# Patient Record
Sex: Male | Born: 1960 | Race: White | Hispanic: No | Marital: Married | Smoking: Never smoker
Health system: Southern US, Community
[De-identification: ages and names within clinical notes are randomized; demographics above are authoritative.]

## PROBLEM LIST (undated history)

## (undated) DIAGNOSIS — K219 Gastro-esophageal reflux disease without esophagitis: Secondary | ICD-10-CM

## (undated) DIAGNOSIS — T7840XA Allergy, unspecified, initial encounter: Secondary | ICD-10-CM

## (undated) DIAGNOSIS — M1990 Unspecified osteoarthritis, unspecified site: Secondary | ICD-10-CM

## (undated) HISTORY — DX: Gastro-esophageal reflux disease without esophagitis: K21.9

## (undated) HISTORY — PX: WISDOM TOOTH EXTRACTION: SHX21

## (undated) HISTORY — DX: Allergy, unspecified, initial encounter: T78.40XA

## (undated) HISTORY — DX: Unspecified osteoarthritis, unspecified site: M19.90

## (undated) HISTORY — PX: VASECTOMY: SHX75

---
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
## 2013-09-16 ENCOUNTER — Telehealth: Payer: Self-pay | Admitting: Family Medicine

## 2013-09-16 NOTE — Telephone Encounter (Signed)
Pt advised no appts available for oday. Pt will go to urgent care.

## 2019-09-09 ENCOUNTER — Ambulatory Visit (INDEPENDENT_AMBULATORY_CARE_PROVIDER_SITE_OTHER): Payer: Commercial Indemnity | Admitting: Family Medicine

## 2019-09-09 ENCOUNTER — Ambulatory Visit (INDEPENDENT_AMBULATORY_CARE_PROVIDER_SITE_OTHER): Payer: Commercial Indemnity

## 2019-09-09 ENCOUNTER — Encounter: Payer: Self-pay | Admitting: Family Medicine

## 2019-09-09 ENCOUNTER — Other Ambulatory Visit: Payer: Self-pay

## 2019-09-09 DIAGNOSIS — M545 Low back pain: Secondary | ICD-10-CM

## 2019-09-09 DIAGNOSIS — G8929 Other chronic pain: Secondary | ICD-10-CM

## 2019-09-09 NOTE — Progress Notes (Signed)
   Office Visit Note   Patient: Travis Zuniga           Date of Birth: 11/19/1960           MRN: [id number] Visit Date: 09/09/2019 Requested by: No referring provider defined for this encounter. PCP: No primary care provider on file.  Subjective: Chief Complaint  Patient presents with  . Lower Back - Pain    Been seeing Dr. Owens Shark for chiropractic care. Diagnosed with degenerative disease Lsp 4-5 years ago. Back "went out" in July. The last adjustment 2 weeks ago helped to where he is currently pain-free. Stretching & icing help.    HPI: He is here at the request of Dr. Purcell Nails for low back pain.  For about 5 or 6 years he has had intermittent pain across the lower back.  Pain does not radiate down the legs, and when it flares up it is worse when standing upright and better when sitting down.  Typically chiropractic adjustments give him quick and very good relief lasting for quite a while.  Most recently he had a flareup in the beginning of July and it took a lot more treatments to get him pain-free like he has been for the past 2 weeks.  Denies any bowel or bladder dysfunction.  He takes ibuprofen rarely for his pain.  He feels better when walking or when making frequent transitions.  He works for the [employer].                ROS:   All other systems were reviewed and are negative.  Objective: Vital Signs: There were no vitals taken for this visit.  Physical Exam:  General:  Alert and oriented, in no acute distress. Pulm:  Breathing unlabored. Psy:  Normal mood, congruent affect.  Low back: Leg lengths are equal, no scoliosis.  No significant tenderness to palpation today.  Pain area is around L4-5, L5-S1.  Negative straight leg raise, good hip range of motion.  Lower extremity strength and reflexes are normal.  Imaging: XR Lumbar Spine 2-3 Views  Result Date: 09/09/2019 X-rays of the lumbar spine reveal anatomic alignment, no hip joint DJD.  SI joints look normal.  There is  mild to moderate L5-S1 degenerative disc disease.  Moderate facet joint arthritis from L3-S1.  No sign of compression fracture or neoplasm.   Assessment & Plan: 1.  Chronic low back pain, suspect due to lumbar facet joint arthropathy.  He has mild to moderate degenerative disc disease at L5-S1 as well. -He will continue with chiropractic.  We will try core strengthening.  Glucosamine, turmeric, avoidance of sugar and processed carbohydrates. -If symptoms worsen, could contemplate facet injections.  If that gives only temporary relief, then radiofrequency neurotomy. -MRI if he fails to improve.     Procedures: No procedures performed  No notes on file     PMFS History: There are no problems to display for this patient.  History reviewed. No pertinent past medical history.  History reviewed. No pertinent family history.  History reviewed. No pertinent surgical history. Social History   Occupational History  . Not on file  Tobacco Use  . Smoking status: Never Smoker  . Smokeless tobacco: Never Used  Substance and Sexual Activity  . Alcohol use: Yes    Comment: 12-pack per month  . Drug use: Not on file  . Sexual activity: Not on file

## 2019-09-09 NOTE — Patient Instructions (Addendum)
   Diagnosis:  Lumbar facet joint arthritis.  Also, mild to moderate degenerative disc disease at L5-S1.  Treatment:  - Core strength - Weight loss - Minimize sugar and processed carbs  - Glucosamine sulfate:  1,000-1,500 mg twice daily - Turmeric:  500 mg twice daily  Future options:    - Lumbar facet joint injections. - MRI if pain worsens.

## 2019-09-20 ENCOUNTER — Other Ambulatory Visit: Payer: Self-pay

## 2019-09-20 ENCOUNTER — Encounter: Payer: Self-pay | Admitting: Family Medicine

## 2019-09-20 ENCOUNTER — Ambulatory Visit (INDEPENDENT_AMBULATORY_CARE_PROVIDER_SITE_OTHER): Payer: Commercial Indemnity | Admitting: Family Medicine

## 2019-09-20 VITALS — BP 128/83 | HR 56 | Ht 70.25 in | Wt 250.4 lb

## 2019-09-20 DIAGNOSIS — Z6835 Body mass index (BMI) 35.0-35.9, adult: Secondary | ICD-10-CM

## 2019-09-20 DIAGNOSIS — Z0000 Encounter for general adult medical examination without abnormal findings: Secondary | ICD-10-CM | POA: Diagnosis not present

## 2019-09-20 DIAGNOSIS — L608 Other nail disorders: Secondary | ICD-10-CM | POA: Diagnosis not present

## 2019-09-20 DIAGNOSIS — E6609 Other obesity due to excess calories: Secondary | ICD-10-CM | POA: Diagnosis not present

## 2019-09-20 DIAGNOSIS — E66812 Obesity, class 2: Secondary | ICD-10-CM | POA: Insufficient documentation

## 2019-09-20 DIAGNOSIS — L989 Disorder of the skin and subcutaneous tissue, unspecified: Secondary | ICD-10-CM

## 2019-09-20 DIAGNOSIS — M722 Plantar fascial fibromatosis: Secondary | ICD-10-CM

## 2019-09-20 DIAGNOSIS — H9313 Tinnitus, bilateral: Secondary | ICD-10-CM

## 2019-09-20 NOTE — Progress Notes (Signed)
Office Visit Note   Patient: Travis Zuniga           Date of Birth: [birth year]           MRN: [id number] Visit Date: 09/20/2019 Requested by: No referring provider defined for this encounter. PCP: No primary care provider on file.  Subjective: Chief Complaint  Patient presents with  . establish primary care    HPI: He is here for a wellness exam.  He has not been to a PCP in years.  Since last visit his back pain has improved significantly.  From a medical standpoint he has been overall healthy.  He does struggle with ringing in both ears.  This has been going on for a while but seems to be getting a little bit worse.  His wife tells him that he has hearing loss as well.  He has a skin lesion on his left forearm that he wanted evaluated.  He has a family history of fatty liver disease leading to cirrhosis.  This has happened in several family members.  There is also a strong history of alcoholism.  Patient does not drink alcohol excessively.  One of his parents also had stroke, and both of his older brothers have been diagnosed with diabetes.  Patient is asymptomatic from that standpoint.                  ROS:   All other systems were reviewed and are negative.  Objective: Vital Signs: BP 128/83   Pulse (!) 56   Ht 5' 10.25" (1.784 m)   Wt 250 lb 6.4 oz (113.6 kg)   BMI 35.67 kg/m   Physical Exam:  General:  Alert and oriented, in no acute distress. Pulm:  Breathing unlabored. Psy:  Normal mood, congruent affect. Skin:  Possible seborrheic keratosis on left forearm.  No other suspicious lesions seen.  HEENT:  [place]/AT, PERRLA, EOM Full, no nystagmus.  Funduscopic examination within normal limits.  No conjunctival erythema.  Tympanic membranes are pearly gray with normal landmarks.  External ear canals are normal.  Nasal passages are clear.  Oropharynx is clear.  No significant lymphadenopathy.  No thyromegaly or nodules.  2+ carotid pulses without bruits. CV: Regular  rate and rhythm without murmurs, rubs, or gallops.  No peripheral edema.  2+ radial and posterior tibial pulses. Lungs: Clear to auscultation throughout with no wheezing or areas of consolidation. Abdomen: No hepatosplenomegaly or masses.  Bowel sounds are active. Ext:  Bilateral longitudinal ridging of the fingernais.   Imaging: No results found.  Assessment & Plan: 1.  Wellness exam - Labs today. - Colonoscopy referral.  2.  Tinnitus - ENT eval  3.  Skin lesion - Derm referral  4.  Ridging of nails - Check for autoimmunity  5.  Obesity with FH of NASH - Labs today - Consider ultrasound       Procedures: No procedures performed  No notes on file     PMFS History: Patient Active Problem List   Diagnosis Date Noted  . Class 2 obesity due to excess calories without serious comorbidity with body mass index (BMI) of 35.0 to 35.9 in adult 09/20/2019  . Tinnitus of both ears 09/20/2019   History reviewed. No pertinent past medical history.  Family History  Problem Relation Age of Onset  . Heart disease Mother        rheumatic fever as child  . Stroke Mother        Smoker  .  Alcohol abuse Mother   . Alcohol abuse Father   . Liver disease Father        Possibly  . Arthritis Sister   . Diabetes Brother   . Liver disease Brother        NASH  . Heart disease Paternal Grandfather        MI  . Diabetes Brother   . Healthy Sister     History reviewed. No pertinent surgical history. Social History   Occupational History  . Not on file  Tobacco Use  . Smoking status: Never Smoker  . Smokeless tobacco: Never Used  Substance and Sexual Activity  . Alcohol use: Yes    Comment: 12-pack per month  . Drug use: Not on file  . Sexual activity: Not on file

## 2019-09-22 LAB — COMPREHENSIVE METABOLIC PANEL
AG Ratio: 1.3 (calc) (ref 1.0–2.5)
ALT: 69 U/L — ABNORMAL HIGH (ref 9–46)
AST: 42 U/L — ABNORMAL HIGH (ref 10–35)
Albumin: 3.9 g/dL (ref 3.6–5.1)
Alkaline phosphatase (APISO): 48 U/L (ref 35–144)
BUN: 12 mg/dL (ref 7–25)
CO2: 29 mmol/L (ref 20–32)
Calcium: 9.4 mg/dL (ref 8.6–10.3)
Chloride: 103 mmol/L (ref 98–110)
Creat: 0.82 mg/dL (ref 0.70–1.25)
Globulin: 3 g/dL (calc) (ref 1.9–3.7)
Glucose, Bld: 107 mg/dL — ABNORMAL HIGH (ref 65–99)
Potassium: 4.6 mmol/L (ref 3.5–5.3)
Sodium: 138 mmol/L (ref 135–146)
Total Bilirubin: 0.6 mg/dL (ref 0.2–1.2)
Total Protein: 6.9 g/dL (ref 6.1–8.1)

## 2019-09-22 LAB — LIPID PANEL
Cholesterol: 170 mg/dL (ref <200)
HDL: 57 mg/dL (ref >=40)
LDL Cholesterol (Calc): 92 mg/dL (calc)
Non-HDL Cholesterol (Calc): 113 mg/dL (calc) (ref <130)
Total CHOL/HDL Ratio: 3 (calc) (ref <5.0)
Triglycerides: 111 mg/dL (ref <150)

## 2019-09-22 LAB — HEMOGLOBIN A1C
Hgb A1c MFr Bld: 5.6 % of total Hgb (ref <5.7)
Mean Plasma Glucose: 114 (calc)
eAG (mmol/L): 6.3 (calc)

## 2019-09-22 LAB — CBC WITH DIFFERENTIAL/PLATELET
Absolute Monocytes: 554 cells/uL (ref 200–950)
Basophils Absolute: 40 cells/uL (ref 0–200)
Basophils Relative: 0.6 %
Eosinophils Absolute: 119 cells/uL (ref 15–500)
Eosinophils Relative: 1.8 %
HCT: 46.1 % (ref 38.5–50.0)
Hemoglobin: 15.3 g/dL (ref 13.2–17.1)
Lymphs Abs: 2396 cells/uL (ref 850–3900)
MCH: 32.2 pg (ref 27.0–33.0)
MCHC: 33.2 g/dL (ref 32.0–36.0)
MCV: 97.1 fL (ref 80.0–100.0)
MPV: 11.4 fL (ref 7.5–12.5)
Monocytes Relative: 8.4 %
Neutro Abs: 3491 cells/uL (ref 1500–7800)
Neutrophils Relative %: 52.9 %
Platelets: 193 10*3/uL (ref 140–400)
RBC: 4.75 10*6/uL (ref 4.20–5.80)
RDW: 12 % (ref 11.0–15.0)
Total Lymphocyte: 36.3 %
WBC: 6.6 10*3/uL (ref 3.8–10.8)

## 2019-09-22 LAB — THYROID PEROXIDASE ANTIBODY: Thyroperoxidase Ab SerPl-aCnc: 1 IU/mL (ref <9)

## 2019-09-22 LAB — FERRITIN: Ferritin: 177 ng/mL (ref 24–380)

## 2019-09-22 LAB — THYROID PANEL WITH TSH
Free Thyroxine Index: 1.8 (ref 1.4–3.8)
T3 Uptake: 30 % (ref 22–35)
T4, Total: 6 ug/dL (ref 4.9–10.5)
TSH: 4.44 mIU/L (ref 0.40–4.50)

## 2019-09-22 LAB — ANA: Anti Nuclear Antibody (ANA): POSITIVE — AB

## 2019-09-22 LAB — PSA: PSA: 1.27 ng/mL (ref <=4.0)

## 2019-09-22 LAB — ANTI-NUCLEAR AB-TITER (ANA TITER): ANA Titer 1: 1:40 {titer} — ABNORMAL HIGH

## 2019-09-22 LAB — HIGH SENSITIVITY CRP: hs-CRP: 0.8 mg/L

## 2019-09-23 ENCOUNTER — Telehealth: Payer: Self-pay | Admitting: Family Medicine

## 2019-09-23 DIAGNOSIS — R7989 Other specified abnormal findings of blood chemistry: Secondary | ICD-10-CM

## 2019-09-23 DIAGNOSIS — Z0000 Encounter for general adult medical examination without abnormal findings: Secondary | ICD-10-CM

## 2019-09-23 DIAGNOSIS — R739 Hyperglycemia, unspecified: Secondary | ICD-10-CM

## 2019-09-23 DIAGNOSIS — R768 Other specified abnormal immunological findings in serum: Secondary | ICD-10-CM

## 2019-09-23 NOTE — Telephone Encounter (Signed)
Labs are notable for the following:  ANA is positive with a low titer of 1: 40.  This indicates autoimmunity of some sort.  Often, this can be a temporary finding.  I will email some suggestions and we can recheck this test in 4 to 6 months.  Thyroid antibodies were normal.  Thyroid panel is in normal range, but TSH is slightly higher than preferred at 4.44.  Ideally this would be 0.5-1.0.  And under-functioning thyroid can be a source of fatigue.  Will recheck this in 4-6 months as well.  For thyroid function I suggest:  - Selenium 150-200 mcg daily - Zinc 20-30 mg daily - Vitamin D3:  2,000 IU daily - Vitamin A:  2,000 IU daily - Iodine:  150 mcg daily  Ferritin and CBC look good, no iron deficiency.  Blood glucose is in prediabetes range at 107, but hemoglobin A1c is still normal.  It is very important to exercise regularly and to minimize dietary intake of processed carbohydrates including breads, pastas, cereals, sugars and sweets.  Recheck this in 4 to 6 months.  All else looks good.

## 2019-09-24 NOTE — Addendum Note (Signed)
Addended by: Hortencia Pilar on: 09/24/2019 07:58 AM   Modules accepted: Orders

## 2019-10-18 NOTE — Addendum Note (Signed)
Addended by: Hortencia Pilar on: 10/18/2019 11:24 AM   Modules accepted: Orders

## 2019-11-04 ENCOUNTER — Encounter: Payer: Self-pay | Admitting: Internal Medicine

## 2019-12-22 ENCOUNTER — Ambulatory Visit (AMBULATORY_SURGERY_CENTER): Payer: Self-pay

## 2019-12-22 ENCOUNTER — Other Ambulatory Visit: Payer: Self-pay

## 2019-12-22 VITALS — Ht 70.0 in | Wt 229.0 lb

## 2019-12-22 DIAGNOSIS — Z1211 Encounter for screening for malignant neoplasm of colon: Secondary | ICD-10-CM

## 2019-12-22 NOTE — Progress Notes (Signed)
No egg or soy allergy known to patient  No issues with past sedation with any surgeries or procedures No intubation problems in the past  No FH of Malignant Hyperthermia No diet pills per patient No home 02 use per patient  No blood thinners per patient  Pt denies issues with constipation  No A fib or A flutter  EMMI video to pt or via [place] 19 guidelines implemented in PV today with Pt and RN  Pt is fully vaccinated  for Covid + Booster Due to the COVID-19 pandemic we are asking patients to follow certain guidelines.  Pt aware of COVID protocols and [facility] guidelines

## 2019-12-27 ENCOUNTER — Encounter: Payer: Self-pay | Admitting: Internal Medicine

## 2020-01-04 DIAGNOSIS — Z86010 Personal history of colonic polyps: Secondary | ICD-10-CM

## 2020-01-04 DIAGNOSIS — Z860101 Personal history of adenomatous and serrated colon polyps: Secondary | ICD-10-CM

## 2020-01-04 HISTORY — DX: Personal history of adenomatous and serrated colon polyps: Z86.0101

## 2020-01-04 HISTORY — DX: Personal history of colonic polyps: Z86.010

## 2020-01-05 ENCOUNTER — Ambulatory Visit (AMBULATORY_SURGERY_CENTER): Payer: Commercial Indemnity | Admitting: Internal Medicine

## 2020-01-05 ENCOUNTER — Other Ambulatory Visit: Payer: Self-pay

## 2020-01-05 ENCOUNTER — Encounter: Payer: Self-pay | Admitting: Internal Medicine

## 2020-01-05 VITALS — BP 121/76 | HR 62 | Temp 97.1°F | Resp 13 | Ht 70.25 in | Wt 229.0 lb

## 2020-01-05 DIAGNOSIS — Z1211 Encounter for screening for malignant neoplasm of colon: Secondary | ICD-10-CM | POA: Diagnosis present

## 2020-01-05 DIAGNOSIS — D120 Benign neoplasm of cecum: Secondary | ICD-10-CM

## 2020-01-05 DIAGNOSIS — D128 Benign neoplasm of rectum: Secondary | ICD-10-CM

## 2020-01-05 DIAGNOSIS — K621 Rectal polyp: Secondary | ICD-10-CM | POA: Diagnosis not present

## 2020-01-05 HISTORY — PX: COLONOSCOPY: SHX174

## 2020-01-05 MED ORDER — SODIUM CHLORIDE 0.9 % IV SOLN: 500.0000 mL | Freq: Once | INTRAVENOUS | Status: DC

## 2020-01-05 NOTE — Op Note (Signed)
[facility] Patient Name: Travis Zuniga Procedure Date: 01/05/2020 11:21 AM MRN: [id number] Endoscopist: Gatha Mayer , MD Age: 59 Referring MD:  Date of Birth: 07/12/1960 Gender: Male Account #: [id number] Procedure:                Colonoscopy Indications:              Screening for colorectal malignant neoplasm, This                            is the patient's first colonoscopy Medicines:                Propofol per Anesthesia, Monitored Anesthesia Care Procedure:                Pre-Anesthesia Assessment:                           - Prior to the procedure, a History and Physical                            was performed, and patient medications and                            allergies were reviewed. The patient's tolerance of                            previous anesthesia was also reviewed. The risks                            and benefits of the procedure and the sedation                            options and risks were discussed with the patient.                            All questions were answered, and informed consent                            was obtained. Prior Anticoagulants: The patient has                            taken no previous anticoagulant or antiplatelet                            agents. ASA Grade Assessment: II - A patient with                            mild systemic disease. After reviewing the risks                            and benefits, the patient was deemed in                            satisfactory condition to undergo the procedure.  After obtaining informed consent, the colonoscope                            was passed under direct vision. Throughout the                            procedure, the patient's blood pressure, pulse, and                            oxygen saturations were monitored continuously. The                            Olympus CF-HQ190L [id number]) Colonoscope was                             introduced through the anus and advanced to the the                            cecum, identified by appendiceal orifice and                            ileocecal valve. The colonoscopy was performed                            without difficulty. The patient tolerated the                            procedure well. The quality of the bowel                            preparation was good. The bowel preparation used                            was Miralax via split dose instruction. The                            ileocecal valve, appendiceal orifice, and rectum                            were photographed. Scope In: 11:32:11 AM Scope Out: 11:47:30 AM Scope Withdrawal Time: 0 hours 10 minutes 58 seconds  Total Procedure Duration: 0 hours 15 minutes 19 seconds  Findings:                 The perianal and digital rectal examinations were                            normal. Pertinent negatives include normal prostate                            (size, shape, and consistency).                           Three sessile polyps were found in the rectum and  cecum. The polyps were diminutive in size. These                            polyps were removed with a cold snare. Resection                            and retrieval were complete. Verification of                            patient identification for the specimen was done.                            Estimated blood loss was minimal.                           The exam was otherwise without abnormality on                            direct and retroflexion views. Complications:            No immediate complications. Estimated Blood Loss:     Estimated blood loss was minimal. Impression:               - Three diminutive polyps in the rectum and in the                            cecum, removed with a cold snare. Resected and                            retrieved.                           - The examination was otherwise normal on  direct                            and retroflexion views. Recommendation:           - Patient has a contact number available for                            emergencies. The signs and symptoms of potential                            delayed complications were discussed with the                            patient. Return to normal activities tomorrow.                            Written discharge instructions were provided to the                            patient.                           - Resume previous diet.                           -  Continue present medications.                           - Repeat colonoscopy is recommended. The                            colonoscopy date will be determined after pathology                            results from today's exam become available for                            review. Gatha Mayer, MD 01/05/2020 11:53:19 AM This report has been signed electronically.

## 2020-01-05 NOTE — Progress Notes (Signed)
Called to room to assist during endoscopic procedure.  Patient ID and intended procedure confirmed with present staff. Received instructions for my participation in the procedure from the performing physician.  

## 2020-01-05 NOTE — Patient Instructions (Addendum)
I found and removed 3 tiny polyps. I will let you know pathology results and when to have another routine colonoscopy by mail and/or My Chart.  I appreciate the opportunity to care for you. Feliz Lincoln E. Kohl Polinsky, MD, FACG    YOU HAD AN ENDOSCOPIC PROCEDURE TODAY AT THE [facility]:   Refer to the procedure report that was given to you for any specific questions about what was found during the examination.  If the procedure report does not answer your questions, please call your gastroenterologist to clarify.  If you requested that your care partner not be given the details of your procedure findings, then the procedure report has been included in a sealed envelope for you to review at your convenience later.  YOU SHOULD EXPECT: Some feelings of bloating in the abdomen. Passage of more gas than usual.  Walking can help get rid of the air that was put into your GI tract during the procedure and reduce the bloating. If you had a lower endoscopy (such as a colonoscopy or flexible sigmoidoscopy) you may notice spotting of blood in your stool or on the toilet paper. If you underwent a bowel prep for your procedure, you may not have a normal bowel movement for a few days.  Please Note:  You might notice some irritation and congestion in your nose or some drainage.  This is from the oxygen used during your procedure.  There is no need for concern and it should clear up in a day or so.  SYMPTOMS TO REPORT IMMEDIATELY:  Following lower endoscopy (colonoscopy or flexible sigmoidoscopy):  Excessive amounts of blood in the stool  Significant tenderness or worsening of abdominal pains  Swelling of the abdomen that is new, acute  Fever of 100F or higher  For urgent or emergent issues, a gastroenterologist can be reached at any hour by calling [phone]. Do not use MyChart messaging for urgent concerns.    DIET:  We do recommend a small meal at first, but then you may proceed to your regular  diet.  Drink plenty of fluids but you should avoid alcoholic beverages for 24 hours.  ACTIVITY:  You should plan to take it easy for the rest of today and you should NOT DRIVE or use heavy machinery until tomorrow (because of the sedation medicines used during the test).    FOLLOW UP: Our staff will call the number listed on your records 48-72 hours following your procedure to check on you and address any questions or concerns that you may have regarding the information given to you following your procedure. If we do not reach you, we will leave a message.  We will attempt to reach you two times.  During this call, we will ask if you have developed any symptoms of COVID 19. If you develop any symptoms (ie: fever, flu-like symptoms, shortness of breath, cough etc.) before then, please call [phone].  If you test positive for Covid 19 in the 2 weeks post procedure, please call and report this information to us.    If any biopsies were taken you will be contacted by phone or by letter within the next 1-3 weeks.  Please call us at [phone] if you have not heard about the biopsies in 3 weeks.    SIGNATURES/CONFIDENTIALITY: You and/or your care partner have signed paperwork which will be entered into your electronic medical record.  These signatures attest to the fact that that the information above on your After Visit   Summary has been reviewed and is understood.  Full responsibility of the confidentiality of this discharge information lies with you and/or your care-partner. 

## 2020-01-05 NOTE — Progress Notes (Signed)
Pt's states no medical or surgical changes since previsit or office visit.   Vitals AG 

## 2020-01-05 NOTE — Progress Notes (Signed)
To PACU, VSS. Report to [email or web address] 

## 2020-01-10 ENCOUNTER — Telehealth: Payer: Self-pay | Admitting: *Deleted

## 2020-01-10 NOTE — Telephone Encounter (Signed)
  Follow up Call-  Call back number 01/05/2020  Post procedure Call Back phone  # [phone]  Permission to leave phone message Yes  Some recent data might be hidden     Patient questions:  Do you have a fever, pain , or abdominal swelling? No. Pain Score  0 *  Have you tolerated food without any problems? Yes.    Have you been able to return to your normal activities? Yes.    Do you have any questions about your discharge instructions: Diet   No. Medications  No. Follow up visit  No.  Do you have questions or concerns about your Care? No.  Actions: * If pain score is 4 or above: No action needed, pain <4.  1. Have you developed a fever since your procedure? no  2.   Have you had an respiratory symptoms (SOB or cough) since your procedure? no  3.   Have you tested positive for COVID 19 since your procedure no  4.   Have you had any family members/close contacts diagnosed with the COVID 19 since your procedure?  no   If yes to any of these questions please route to Laverna Peace, RN and Karlton Lemon, RN

## 2020-01-13 ENCOUNTER — Encounter: Payer: Self-pay | Admitting: Internal Medicine

## 2020-01-13 DIAGNOSIS — Z86010 Personal history of colonic polyps: Secondary | ICD-10-CM

## 2020-03-05 ENCOUNTER — Encounter: Payer: Self-pay | Admitting: Family Medicine

## 2020-03-14 ENCOUNTER — Encounter: Payer: Self-pay | Admitting: Orthopaedic Surgery

## 2020-03-14 ENCOUNTER — Other Ambulatory Visit: Payer: Self-pay

## 2020-03-14 ENCOUNTER — Ambulatory Visit (INDEPENDENT_AMBULATORY_CARE_PROVIDER_SITE_OTHER): Admitting: Orthopaedic Surgery

## 2020-03-14 ENCOUNTER — Ambulatory Visit: Payer: Self-pay

## 2020-03-14 DIAGNOSIS — G8929 Other chronic pain: Secondary | ICD-10-CM

## 2020-03-14 DIAGNOSIS — M25511 Pain in right shoulder: Secondary | ICD-10-CM

## 2020-03-14 NOTE — Progress Notes (Signed)
Office Visit Note   Patient: Travis Zuniga           Date of Birth: 1960-05-29           MRN: [id number] Visit Date: 03/14/2020              Requested by: Eunice Blase, MD [place],  [place] [address] PCP: Eunice Blase, MD   Assessment & Plan: Visit Diagnoses:  1. Chronic right shoulder pain     Plan: Impression is right shoulder rotator cuff tear and SLAP tear.  We will need to obtain MR arthrogram to fully evaluate for suspected pathologies.  Work note provided for less than 15 pounds of lifting.  Return after the MRI.  Total face to face encounter time was greater than 45 minutes and over half of this time was spent in counseling and/or coordination of care.  Follow-Up Instructions: Return if symptoms worsen or fail to improve.   Orders:  Orders Placed This Encounter  Procedures  . XR Shoulder Right   No orders of the defined types were placed in this encounter.     Procedures: No procedures performed   Clinical Data: No additional findings.   Subjective: Chief Complaint  Patient presents with  . 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  He slipped on ice and snow and fell twice he landed on his right forearm and elbow and jammed his shoulder upwards.  He has been to his chiropractor 3 times without any significant relief.  He notices pain in the supraspinatus fossa as well as radiation of pain down into the lateral shoulder.  Denies any numbness and tingling or radicular symptoms.  He mainly has pain at night which Advil helps with.  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   Review of Systems  Constitutional: Negative.   All other systems reviewed and are negative.    Objective: Vital Signs: There were no vitals taken for this visit.  Physical  Exam Vitals and nursing note reviewed.  Constitutional:      Appearance: He is well-developed and well-nourished.  HENT:     Head: Normocephalic and atraumatic.  Eyes:     Pupils: Pupils are equal, round, and reactive to light.  Pulmonary:     Effort: Pulmonary effort is normal.  Abdominal:     Palpations: Abdomen is soft.  Musculoskeletal:        General: Normal range of motion.     Cervical back: Neck supple.  Skin:    General: Skin is warm.  Neurological:     Mental Status: He is alert and oriented to person, place, and time.  Psychiatric:        Mood and Affect: Mood and affect normal.        Behavior: Behavior normal.        Thought Content: Thought content normal.        Judgment: Judgment normal.     Ortho Exam Right shoulder exam shows moderate pain with empty can and minimal weakness.  Moderate pain and moderate weakness with infraspinatus testing.  Moderate pain and moderate weakness with belly press.  Positive O'Brien test.  Active range of motion is well compensated.  Passive range of motion is normal.  AC joint and biceps are nontender.  Specialty Comments:  No specialty comments available.  Imaging: XR Shoulder Right  Result Date: 03/14/2020 No acute or structural abnormalities    PMFS History: Patient Active Problem List   Diagnosis Date Noted  . Hx of adenomatous polyp of colon 01/04/2020  . Class 2 obesity due to excess calories without serious comorbidity with body mass index (BMI) of 35.0 to 35.9 in adult 09/20/2019  . Tinnitus of both ears 09/20/2019   Past Medical History:  Diagnosis Date  . Allergy    seasonal allergies  . Arthritis    lower back   . GERD (gastroesophageal reflux disease)    with certain foods/prior to weight loss/uses OTC meds PRN  . Hx of adenomatous polyp of colon 01/04/2020   diminutive    Family History  Problem Relation Age of Onset  . Heart disease Mother        rheumatic fever as child  . Stroke Mother         Smoker  . Alcohol abuse Mother   . Alcohol abuse Father   . Liver disease Father        Possibly  . Arthritis Sister   . Diabetes Brother   . Liver disease Brother        NASH  . Heart disease Paternal Grandfather        MI  . Diabetes Brother   . Colon polyps Brother   . Healthy Sister   . Colon cancer Neg Hx   . Esophageal cancer Neg Hx   . Rectal cancer Neg Hx   . Stomach cancer Neg Hx     Past Surgical History:  Procedure Laterality Date  . COLONOSCOPY  01/05/2020  . LUNG SURGERY N/A 1961   lung surgery-lung collapsed-  . VASECTOMY N/A   . WISDOM TOOTH EXTRACTION     Social History   Occupational History    Employer: [employer]  Tobacco Use  . Smoking status: Never Smoker  . Smokeless tobacco: Never Used  Vaping Use  . Vaping Use: Never used  Substance and Sexual Activity  . Alcohol use: Yes    Comment: a 12 pack per month  . Drug use: Never  . Sexual activity: Not on file

## 2020-03-14 NOTE — Addendum Note (Signed)
Addended by: Precious Bard on: 03/14/2020 01:07 PM   Modules accepted: Orders

## 2021-01-17 ENCOUNTER — Ambulatory Visit (INDEPENDENT_AMBULATORY_CARE_PROVIDER_SITE_OTHER): Payer: Commercial Indemnity | Admitting: Family Medicine

## 2021-01-17 ENCOUNTER — Other Ambulatory Visit: Payer: Self-pay

## 2021-01-17 ENCOUNTER — Encounter (HOSPITAL_BASED_OUTPATIENT_CLINIC_OR_DEPARTMENT_OTHER): Payer: Self-pay | Admitting: Family Medicine

## 2021-01-17 VITALS — BP 118/88 | HR 57 | Ht 71.0 in | Wt 246.2 lb

## 2021-01-17 DIAGNOSIS — R768 Other specified abnormal immunological findings in serum: Secondary | ICD-10-CM | POA: Diagnosis not present

## 2021-01-17 DIAGNOSIS — Z0000 Encounter for general adult medical examination without abnormal findings: Secondary | ICD-10-CM

## 2021-01-17 DIAGNOSIS — E669 Obesity, unspecified: Secondary | ICD-10-CM | POA: Diagnosis not present

## 2021-01-17 MED ORDER — SHINGRIX 50 MCG/0.5ML IM SUSR: 0.5000 mL | Freq: Once | INTRAMUSCULAR | 0 refills | Status: AC

## 2021-01-17 NOTE — Progress Notes (Signed)
New Patient Office Visit  Subjective:  Patient ID: Travis Zuniga, male    DOB: 07/11/1960  Age: 61 y.o. MRN: [id number]  CC:  Chief Complaint  Patient presents with   Establish Care    Prior PCP - Dr. 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 He had imaging about 1 year ago with prior pcp. He has been trying to manage with chiropractic adjustments and an antiinflammatory diet.    Plantar fibromatosis    Patient had been consulting a podiatrist to manage but has not needed to go within the last couple of years. Patient states the pain is pretty well managed and is not inhibiting his ability to walk.     HPI Travis Zuniga is a 61 year old male presenting to establish in clinic.  He has current concerns as outlined above.  Past medical history of degenerative disc disease, plantar fibromatosis.  Positive ANA: On prior labs with PCP, patient was found to have positive ANA and was advised on various herbal supplements.  Plan was reportedly to recheck ANA in the future to monitor, requesting repeat today.  Patient is originally from [place], [place].  He has lived here in the area for 26 years, works as a Freight forwarder.  Outside of work, he enjoys hiking.  Past Medical History:  Diagnosis Date   Allergy    seasonal allergies   Arthritis    lower back    GERD (gastroesophageal reflux disease)    with certain foods/prior to weight loss/uses OTC meds PRN   Hx of adenomatous polyp of colon 01/04/2020   diminutive    Past Surgical History:  Procedure Laterality Date   COLONOSCOPY  01/05/2020   LUNG SURGERY N/A 1961   lung surgery-lung collapsed-   VASECTOMY N/A    WISDOM TOOTH EXTRACTION      Family History  Problem Relation Age of Onset   Heart disease Mother        rheumatic fever as child   Stroke Mother        Smoker   Alcohol abuse Mother    Miscarriages / [place] Mother    Alcohol abuse Father     Liver disease Father        Possibly   Arthritis Sister    Diabetes Brother    Liver disease Brother        NASH   Heart disease Paternal Grandfather        MI   Diabetes Brother    Colon polyps Brother    Healthy Sister    Colon cancer Neg Hx    Esophageal cancer Neg Hx    Rectal cancer Neg Hx    Stomach cancer Neg Hx     Social History   Socioeconomic History   Marital status: Married    Spouse name: Not on file   Number of children: Not on file   Years of education: Not on file   Highest education level: Not on file  Occupational History    Employer: [employer]  Tobacco Use   Smoking status: Never   Smokeless tobacco: Never  Vaping Use   Vaping Use: Never used  Substance and Sexual Activity   Alcohol use: Yes    Comment: a 12 pack per month   Drug use: Never   Sexual activity: Yes    Birth control/protection: None  Other Topics Concern   Not on file  Social  History Narrative   Not on file   Social Determinants of Health   Financial Resource Strain: Not on file  Food Insecurity: Not on file  Transportation Needs: Not on file  Physical Activity: Not on file  Stress: Not on file  Social Connections: Not on file  Intimate Partner Violence: Not on file    Objective:   Today's Vitals: BP 118/88    Pulse (!) 57    Ht 5\' 11"  (1.803 m)    Wt 246 lb 3.2 oz (111.7 kg)    SpO2 98%    BMI 34.34 kg/m   Physical Exam  61 year old male in no acute distress Cardiovascular exam with regular rate and rhythm, no murmur appreciated Lungs clear to auscultation bilaterally  Assessment & Plan:   Problem List Items Addressed This Visit       Other   Positive ANA (antinuclear antibody) - Primary    Labs with prior PCP revealed positive ANA.  He was advised on completing repeat testing in future to assess if this is a true positive result, will repeat testing today      Relevant Orders   ANA w/Reflex if Positive (Completed)   Obesity, Class I, BMI 30-34.9   Relevant  Orders   CBC with Differential/Platelet (Completed)   Comprehensive metabolic panel (Completed)   Hemoglobin A1c (Completed)   Lipid panel (Completed)   Other Visit Diagnoses     Wellness examination       Relevant Orders   CBC with Differential/Platelet (Completed)   Comprehensive metabolic panel (Completed)   Hemoglobin A1c (Completed)   Lipid panel (Completed)   TSH Rfx on Abnormal to Free T4 (Completed)       Outpatient Encounter Medications as of 01/17/2021  Medication Sig   Glucosamine 500 MG CAPS Take 1,000 mg by mouth 2 (two) times daily.   IODINE, KELP, PO Take 15 mg by mouth daily.   L-TYROSINE PO Take 200 mg by mouth daily.   Multiple Vitamin (MULTIVITAMIN) tablet Take 1 tablet by mouth daily.   Multiple Vitamins-Minerals (ZINC PO) Take 30 mg by mouth daily.   Pyridoxine HCl (VITAMIN B-6 PO) Take 20 mg by mouth daily.   SELENIUM PO Take 150 mcg by mouth daily.   Turmeric 500 MG CAPS Take 1 capsule by mouth 2 (two) times daily.   VITAMIN A PO Take 3,000 mcg by mouth daily.   Vitamin D-Vitamin K (VITAMIN K2-VITAMIN D3 PO) Take by mouth daily.   [EXPIRED] Zoster Vaccine Adjuvanted [facility]) injection Inject 0.5 mLs into the muscle once for 1 dose. Administer at 0 and 2-6 months for 2 total doses.  To be administered by pharmacy.   No facility-administered encounter medications on file as of 01/17/2021.    Follow-up: Return in about 2 months (around 03/17/2021).  Plan for follow-up in about 1 to 2 months for wellness exam, will complete labs today.  Dorse Locy J De [place], MD

## 2021-01-17 NOTE — Patient Instructions (Signed)
°  Medication Instructions:  Your physician recommends that you continue on your current medications as directed. Please refer to the Current Medication list given to you today. --If you need a refill on any your medications before your next appointment, please call your pharmacy first. If no refills are authorized on file call the office.-- Lab Work: Your physician has recommended that you have lab work today: CBC, CMP, Lipid, A1C, TSH, and ANA If you have labs (blood work) drawn today and your tests are completely normal, you will receive your results via [facility] a phone call from our staff.  Please ensure you check your voicemail in the event that you authorized detailed messages to be left on a delegated number. If you have any lab test that is abnormal or we need to change your treatment, we will call you to review the results.  Follow-Up: Your next appointment:   Your physician recommends that you schedule a follow-up appointment in: 1-2 MONTHS for CPE with Dr. de [place]  You will receive a text message or e-mail with a link to a survey about your care and experience with [place] today! We would greatly appreciate your feedback!   Thanks for letting us be apart of your health journey!!  Primary Care and Sports Medicine   Dr. Arlina Robes [place]   We encourage you to activate your patient portal called "MyChart".  Sign up information is provided on this After Visit Summary.  MyChart is used to connect with patients for Virtual Visits (Telemedicine).  Patients are able to view lab/test results, encounter notes, upcoming appointments, etc.  Non-urgent messages can be sent to your provider as well. To learn more about what you can do with MyChart, please visit --  [email or web address].

## 2021-01-18 LAB — CBC WITH DIFFERENTIAL/PLATELET
Basophils Absolute: 0.1 10*3/uL (ref 0.0–0.2)
Basos: 1 %
EOS (ABSOLUTE): 0.2 10*3/uL (ref 0.0–0.4)
Eos: 2 %
Hematocrit: 48.5 % (ref 37.5–51.0)
Hemoglobin: 16.5 g/dL (ref 13.0–17.7)
Immature Grans (Abs): 0 10*3/uL (ref 0.0–0.1)
Immature Granulocytes: 0 %
Lymphocytes Absolute: 3 10*3/uL (ref 0.7–3.1)
Lymphs: 33 %
MCH: 32.3 pg (ref 26.6–33.0)
MCHC: 34 g/dL (ref 31.5–35.7)
MCV: 95 fL (ref 79–97)
Monocytes Absolute: 0.8 10*3/uL (ref 0.1–0.9)
Monocytes: 9 %
Neutrophils Absolute: 4.8 10*3/uL (ref 1.4–7.0)
Neutrophils: 55 %
Platelets: 222 10*3/uL (ref 150–450)
RBC: 5.11 x10E6/uL (ref 4.14–5.80)
RDW: 12.1 % (ref 11.6–15.4)
WBC: 8.8 10*3/uL (ref 3.4–10.8)

## 2021-01-18 LAB — COMPREHENSIVE METABOLIC PANEL
ALT: 75 IU/L — ABNORMAL HIGH (ref 0–44)
AST: 49 IU/L — ABNORMAL HIGH (ref 0–40)
Albumin/Globulin Ratio: 1.4 (ref 1.2–2.2)
Albumin: 4.5 g/dL (ref 3.8–4.8)
Alkaline Phosphatase: 72 IU/L (ref 44–121)
BUN/Creatinine Ratio: 14 (ref 10–24)
BUN: 14 mg/dL (ref 8–27)
Bilirubin Total: 0.6 mg/dL (ref 0.0–1.2)
CO2: 22 mmol/L (ref 20–29)
Calcium: 10 mg/dL (ref 8.6–10.2)
Chloride: 103 mmol/L (ref 96–106)
Creatinine, Ser: 0.97 mg/dL (ref 0.76–1.27)
Globulin, Total: 3.3 g/dL (ref 1.5–4.5)
Glucose: 102 mg/dL — ABNORMAL HIGH (ref 70–99)
Potassium: 4.6 mmol/L (ref 3.5–5.2)
Sodium: 142 mmol/L (ref 134–144)
Total Protein: 7.8 g/dL (ref 6.0–8.5)
eGFR: 88 mL/min/{1.73_m2} (ref >59)

## 2021-01-18 LAB — HEMOGLOBIN A1C
Est. average glucose Bld gHb Est-mCnc: 120 mg/dL
Hgb A1c MFr Bld: 5.8 % — ABNORMAL HIGH (ref 4.8–5.6)

## 2021-01-18 LAB — LIPID PANEL
Chol/HDL Ratio: 3.2 ratio (ref 0.0–5.0)
Cholesterol, Total: 203 mg/dL — ABNORMAL HIGH (ref 100–199)
HDL: 64 mg/dL (ref >39)
LDL Chol Calc (NIH): 120 mg/dL — ABNORMAL HIGH (ref 0–99)
Triglycerides: 109 mg/dL (ref 0–149)
VLDL Cholesterol Cal: 19 mg/dL (ref 5–40)

## 2021-01-18 LAB — ANA W/REFLEX IF POSITIVE: Anti Nuclear Antibody (ANA): NEGATIVE

## 2021-01-18 LAB — TSH RFX ON ABNORMAL TO FREE T4: TSH: 4.79 u[IU]/mL — ABNORMAL HIGH (ref 0.450–4.500)

## 2021-01-18 LAB — T4F: T4,Free (Direct): 1 ng/dL (ref 0.82–1.77)

## 2021-01-18 NOTE — Assessment & Plan Note (Signed)
Labs with prior PCP revealed positive ANA.  He was advised on completing repeat testing in future to assess if this is a true positive result, will repeat testing today

## 2021-01-18 NOTE — Assessment & Plan Note (Signed)
Recommend lifestyle modifications to work towards healthy, gradual weight loss, particularly with gradual increase in physical activity, dietary adjustments We will be checking labs today in anticipation of upcoming wellness exam Further recommendations regarding management of weight pending lab results

## 2021-01-21 ENCOUNTER — Telehealth (HOSPITAL_BASED_OUTPATIENT_CLINIC_OR_DEPARTMENT_OTHER): Payer: Self-pay

## 2021-01-21 DIAGNOSIS — R748 Abnormal levels of other serum enzymes: Secondary | ICD-10-CM

## 2021-01-21 NOTE — Telephone Encounter (Signed)
-----   Message from Raymond J de [place], MD sent at 01/18/2021  4:26 PM EST ----- White blood cell and red blood cell counts are normal with normal hemoglobin.  Electrolytes and kidney function are normal.  ANA which was positive in the past is negative on repeat testing, no specific follow-up needed regarding this.  Lipid panel with elevated total cholesterol and elevated "bad" cholesterol.  Would initially focus on lifestyle modifications to address cholesterol issues - including dietary changes such as incorporating fresh fruits and vegetables, lean protein in the diet and reducing consumption of red meats, saturated fats, processed foods.  Recommend gradually increasing level of activity with eventual goal of about 150 minutes/week of moderate intensity aerobic exercise. Hemoglobin A1c which measures approximately 52-month average of blood sugars is slightly elevated at 5.8%.  This falls within "prediabetes" range.  This finding indicates an increased risk of developing diabetes in the future.  Primary recommendations are for lifestyle modifications as discussed above.  Liver enzymes are slightly elevated, stable from prior check about 1 year ago.  Due to this elevation, would complete further testing to rule out potential viral causes as well as iron storage issues.  Would also complete ultrasound of liver.  TSH was very slightly above normal range, direct measurement of thyroid hormone T4 was within normal range.  With these findings, we can likely repeat thyroid testing about 3 to 6 months in order to monitor this.  Above laboratory findings can be discussed further at next office visit. Would check hepatitis B panel, hepatitis C, iron and TIBC, as well as liver ultrasound.

## 2021-01-21 NOTE — Telephone Encounter (Signed)
Results released by Dr. de [place] and reviewed by patient via MyChart Instructed patient to contact the office with any questions or concerns.  

## 2021-02-02 ENCOUNTER — Ambulatory Visit
Admission: RE | Admit: 2021-02-02 | Discharge: 2021-02-02 | Disposition: A | Discharge status: Home / Self Care | Payer: 59 | Source: Ambulatory Visit | Attending: Family Medicine | Admitting: Family Medicine

## 2021-02-02 ENCOUNTER — Other Ambulatory Visit: Payer: Self-pay

## 2021-02-02 DIAGNOSIS — R748 Abnormal levels of other serum enzymes: Secondary | ICD-10-CM

## 2021-03-08 ENCOUNTER — Other Ambulatory Visit: Payer: Self-pay

## 2021-03-08 ENCOUNTER — Ambulatory Visit (INDEPENDENT_AMBULATORY_CARE_PROVIDER_SITE_OTHER): Payer: 59 | Admitting: Family Medicine

## 2021-03-08 ENCOUNTER — Encounter (HOSPITAL_BASED_OUTPATIENT_CLINIC_OR_DEPARTMENT_OTHER): Payer: Self-pay | Admitting: Family Medicine

## 2021-03-08 DIAGNOSIS — Z0000 Encounter for general adult medical examination without abnormal findings: Secondary | ICD-10-CM

## 2021-03-08 DIAGNOSIS — R748 Abnormal levels of other serum enzymes: Secondary | ICD-10-CM

## 2021-03-08 NOTE — Patient Instructions (Signed)
?  Medication Instructions:  ?Your physician recommends that you continue on your current medications as directed. Please refer to the Current Medication list given to you today. ?--If you need a refill on any your medications before your next appointment, please call your pharmacy first. If no refills are authorized on file call the office.-- ?Lab Work: ?Your physician has recommended that you have lab work today: Hep B and Hep C and Iron Studies ?If you have labs (blood work) drawn today and your tests are completely normal, you will receive your results via [facility] a phone call from our staff.  ?Please ensure you check your voicemail in the event that you authorized detailed messages to be left on a delegated number. If you have any lab test that is abnormal or we need to change your treatment, we will call you to review the results. ?Follow-Up: ?Your next appointment:   ?Your physician recommends that you schedule a follow-up appointment in: 4-6 MONTHS with Dr. de [place] ? ?You will receive a text message or e-mail with a link to a survey about your care and experience with [place] today! We would greatly appreciate your feedback!  ? ?Thanks for letting us be apart of your health journey!!  ?Primary Care and Sports Medicine  ? ?Dr. Kyung Rudd de [place]  ? ?We encourage you to activate your patient portal called "MyChart".  Sign up information is provided on this After Visit Summary.  MyChart is used to connect with patients for Virtual Visits (Telemedicine).  Patients are able to view lab/test results, encounter notes, upcoming appointments, etc.  Non-urgent messages can be sent to your provider as well. To learn more about what you can do with MyChart, please visit --  [email or web address].   ? ?

## 2021-03-08 NOTE — Progress Notes (Signed)
?Subjective:   ? ?CC: Annual Physical Exam ? ?HPI:  ?Travis Zuniga is a 61 y.o. presenting for annual physical ? ?I reviewed the past medical history, family history, social history, surgical history, and allergies today and no changes were needed.  Please see the problem list section below in epic for further details. ? ?Past Medical History: ?Past Medical History:  ?Diagnosis Date  ? Allergy   ? seasonal allergies  ? Arthritis   ? lower back   ? GERD (gastroesophageal reflux disease)   ? with certain foods/prior to weight loss/uses OTC meds PRN  ? Hx of adenomatous polyp of colon 01/04/2020  ? diminutive  ? ?Past Surgical History: ?Past Surgical History:  ?Procedure Laterality Date  ? COLONOSCOPY  01/05/2020  ? LUNG SURGERY N/A 1961  ? lung surgery-lung collapsed-  ? VASECTOMY N/A   ? WISDOM TOOTH EXTRACTION    ? ?Social History: ?Social History  ? ?Socioeconomic History  ? Marital status: Married  ?  Spouse name: Not on file  ? Number of children: Not on file  ? Years of education: Not on file  ? Highest education level: Not on file  ?Occupational History  ?  Employer: [employer]  ?Tobacco Use  ? Smoking status: Never  ? Smokeless tobacco: Never  ?Vaping Use  ? Vaping Use: Never used  ?Substance and Sexual Activity  ? Alcohol use: Yes  ?  Comment: a 12 pack per month  ? Drug use: Never  ? Sexual activity: Yes  ?  Birth control/protection: None  ?Other Topics Concern  ? Not on file  ?Social History Narrative  ? Not on file  ? ?Social Determinants of Health  ? ?Financial Resource Strain: Not on file  ?Food Insecurity: Not on file  ?Transportation Needs: Not on file  ?Physical Activity: Not on file  ?Stress: Not on file  ?Social Connections: Not on file  ? ?Family History: ?Family History  ?Problem Relation Age of Onset  ? Heart disease Mother   ?     rheumatic fever as child  ? Stroke Mother   ?     Smoker  ? Alcohol abuse Mother   ? Miscarriages / [place] Mother   ? Alcohol abuse Father   ? Liver disease Father    ?     Possibly  ? Arthritis Sister   ? Diabetes Brother   ? Liver disease Brother   ?     NASH  ? Heart disease Paternal Grandfather   ?     MI  ? Diabetes Brother   ? Colon polyps Brother   ? Healthy Sister   ? Colon cancer Neg Hx   ? Esophageal cancer Neg Hx   ? Rectal cancer Neg Hx   ? Stomach cancer Neg Hx   ? ?Allergies: ?No Known Allergies ?Medications: See med rec. ? ?Review of Systems: No headache, visual changes, nausea, vomiting, diarrhea, constipation, dizziness, abdominal pain, skin rash, fevers, chills, night sweats, swollen lymph nodes, weight loss, chest pain, body aches, joint swelling, muscle aches, shortness of breath, mood changes, visual or auditory hallucinations. ? ?Objective:   ? ?BP 124/84   Pulse 71   Ht 5\' 11"  (1.803 m)   Wt 244 lb 9.6 oz (110.9 kg)   SpO2 96%   BMI 34.11 kg/m?  ? ?General: Well Developed, well nourished, and in no acute distress.  ?Neuro: Alert and oriented x3, extra-ocular muscles intact, sensation grossly intact. Cranial nerves II through XII are intact, motor,  sensory, and coordinative functions are all intact. ?HEENT: Normocephalic, atraumatic, pupils equal round reactive to light, neck supple, no masses, no lymphadenopathy, thyroid nonpalpable. Oropharynx, nasopharynx, external ear canals are unremarkable. ?Skin: Warm and dry, no rashes noted.  ?Cardiac: Regular rate and rhythm, no murmurs rubs or gallops.  ?Respiratory: Clear to auscultation bilaterally. Not using accessory muscles, speaking in full sentences.  ?Abdominal: Soft, nontender, nondistended, positive bowel sounds, no masses, no organomegaly.  ?Musculoskeletal: Shoulder, elbow, wrist, hip, knee, ankle stable, and with full range of motion. ? ?Impression and Recommendations:   ? ?Wellness examination ?Routine HCM labs reviewed - prediabetes, fatty liver. HCM reviewed/discussed. Anticipatory guidance regarding healthy weight, lifestyle and choices given. ?Recommend healthy diet.  Recommend  approximately 150 minutes/week of moderate intensity exercise ?Recommend regular dental and vision exams ?Always use seatbelt/lap and shoulder restraints ?Recommend using smoke alarms and checking batteries at least twice a year ?Recommend using sunscreen when outside ?Recommend Shingles vaccine - prescription has been sent to pharmacy, patient still needs to arrange ?Patient's brother has been having some health concerns - has been causing some increased stress/anxiety, patient feels that he is coping well thus far ? ?Plan for follow-up in 4-6 months to review progress with lifestyle modifications, working toward weight loss. Will plan to check A1c and TSH with reflex at that time. ? ? ?___________________________________________ ?Mihail Prettyman de [place], MD, ABFM, CAQSM ?Primary Care and Sports Medicine ?[facility] ?

## 2021-03-08 NOTE — Assessment & Plan Note (Addendum)
Routine HCM labs reviewed - prediabetes, fatty liver. HCM reviewed/discussed. Anticipatory guidance regarding healthy weight, lifestyle and choices given. ?Recommend healthy diet.  Recommend approximately 150 minutes/week of moderate intensity exercise ?Recommend regular dental and vision exams ?Always use seatbelt/lap and shoulder restraints ?Recommend using smoke alarms and checking batteries at least twice a year ?Recommend using sunscreen when outside ?Recommend Shingles vaccine - prescription has been sent to pharmacy, patient still needs to arrange ?Patient's brother has been having some health concerns - has been causing some increased stress/anxiety, patient feels that he is coping well thus far ?

## 2021-03-09 LAB — IRON AND TIBC
Iron Saturation: 42 % (ref 15–55)
Iron: 130 ug/dL (ref 38–169)
Total Iron Binding Capacity: 306 ug/dL (ref 250–450)
UIBC: 176 ug/dL (ref 111–343)

## 2021-03-09 LAB — FERRITIN: Ferritin: 167 ng/mL (ref 30–400)

## 2021-03-09 LAB — HEPATITIS B SURFACE ANTIBODY,QUALITATIVE: Hep B Surface Ab, Qual: NONREACTIVE

## 2021-03-09 LAB — HEPATITIS B CORE ANTIBODY, IGM: Hep B C IgM: NEGATIVE

## 2021-03-09 LAB — HEPATITIS B SURFACE ANTIGEN: Hepatitis B Surface Ag: NEGATIVE

## 2021-03-09 LAB — HEPATITIS C ANTIBODY: Hep C Virus Ab: NONREACTIVE

## 2021-08-23 ENCOUNTER — Encounter (HOSPITAL_BASED_OUTPATIENT_CLINIC_OR_DEPARTMENT_OTHER): Payer: Self-pay | Admitting: Family Medicine

## 2021-08-23 ENCOUNTER — Ambulatory Visit (HOSPITAL_BASED_OUTPATIENT_CLINIC_OR_DEPARTMENT_OTHER): Payer: 59 | Admitting: Family Medicine

## 2021-08-23 DIAGNOSIS — E039 Hypothyroidism, unspecified: Secondary | ICD-10-CM | POA: Insufficient documentation

## 2021-08-23 DIAGNOSIS — R7303 Prediabetes: Secondary | ICD-10-CM | POA: Diagnosis not present

## 2021-08-23 DIAGNOSIS — E038 Other specified hypothyroidism: Secondary | ICD-10-CM | POA: Diagnosis not present

## 2021-08-23 DIAGNOSIS — K760 Fatty (change of) liver, not elsewhere classified: Secondary | ICD-10-CM | POA: Diagnosis not present

## 2021-08-23 DIAGNOSIS — R7401 Elevation of levels of liver transaminase levels: Secondary | ICD-10-CM | POA: Diagnosis not present

## 2021-08-23 DIAGNOSIS — Z0000 Encounter for general adult medical examination without abnormal findings: Secondary | ICD-10-CM

## 2021-08-23 HISTORY — DX: Other specified hypothyroidism: E03.8

## 2021-08-23 NOTE — Patient Instructions (Signed)
  Medication Instructions:  Your physician recommends that you continue on your current medications as directed. Please refer to the Current Medication list given to you today. --If you need a refill on any your medications before your next appointment, please call your pharmacy first. If no refills are authorized on file call the office.-- Lab Work: Your physician has recommended that you have lab work today: Yes If you have labs (blood work) drawn today and your tests are completely normal, you will receive your results via [facility] a phone call from our staff.  Please ensure you check your voicemail in the event that you authorized detailed messages to be left on a delegated number. If you have any lab test that is abnormal or we need to change your treatment, we will call you to review the results.  Referrals/Procedures/Imaging: No  Follow-Up: Your next appointment:   Your physician recommends that you schedule a follow-up appointment in: 6 months cpe with Dr. de [place].  You will receive a text message or e-mail with a link to a survey about your care and experience with [place] today! We would greatly appreciate your feedback!   Thanks for letting us be apart of your health journey!!  Primary Care and Sports Medicine   Dr. Arlina Robes [place]   We encourage you to activate your patient portal called "MyChart".  Sign up information is provided on this After Visit Summary.  MyChart is used to connect with patients for Virtual Visits (Telemedicine).  Patients are able to view lab/test results, encounter notes, upcoming appointments, etc.  Non-urgent messages can be sent to your provider as well. To learn more about what you can do with MyChart, please visit --  [email or web address].

## 2021-08-23 NOTE — Assessment & Plan Note (Signed)
Previously found to have slightly elevated AST and ALT, subsequent evaluation was negative for hepatitis B, hepatitis C, iron storage disease.  Right upper quadrant ultrasound did show evidence of fatty liver disease Patient has been trying to work on lifestyle modifications.  He does not have any acute concerns today regarding this We will plan to recheck liver enzymes today for monitoring

## 2021-08-23 NOTE — Assessment & Plan Note (Signed)
Found to have slightly elevated TSH on prior labs with normal free T4 at that time.  Patient remains clinically euthyroid, no acute concerns today We will plan to recheck thyroid studies today for monitoring.  Did discuss that normal range for TSH can be higher and that observed as reference range on labs and thus not concerned at this time regarding slight elevation in TSH.  Would recommend continue with monitoring and reassessing for development of any symptoms moving forward.

## 2021-08-23 NOTE — Progress Notes (Signed)
    Procedures performed today:    None.  Independent interpretation of notes and tests performed by another provider:   None.  Brief History, Exam, Impression, and Recommendations:    BP 126/81   Pulse 63   Ht '5\' 11"'$  (1.803 m)   Wt 249 lb 1.6 oz (113 kg)   SpO2 97%   BMI 34.74 kg/m   Hepatic steatosis Previously found to have slightly elevated AST and ALT, subsequent evaluation was negative for hepatitis B, hepatitis C, iron storage disease.  Right upper quadrant ultrasound did show evidence of fatty liver disease Patient has been trying to work on lifestyle modifications.  He does not have any acute concerns today regarding this We will plan to recheck liver enzymes today for monitoring  Subclinical hypothyroidism Found to have slightly elevated TSH on prior labs with normal free T4 at that time.  Patient remains clinically euthyroid, no acute concerns today We will plan to recheck thyroid studies today for monitoring.  Did discuss that normal range for TSH can be higher and that observed as reference range on labs and thus not concerned at this time regarding slight elevation in TSH.  Would recommend continue with monitoring and reassessing for development of any symptoms moving forward.  Prediabetes He has been working on lifestyle modifications, however admits that he has not been able to be as active as he would like, partly related to the hot weather during the summer.  He does enjoy hiking and plans to be more this when there is more cooler weather. Most recent hemoglobin A1c did show slight increase and is within prediabetes range at 5.8%.  We will proceed with labs today for continued monitoring  Return in about 6 months (around 02/23/2022) for CPE with [facility]W a few days prior.   ___________________________________________ Solangel Mcmanaway de [place], MD, ABFM, CAQSM Primary Care and [place]

## 2021-08-23 NOTE — Assessment & Plan Note (Signed)
He has been working on lifestyle modifications, however admits that he has not been able to be as active as he would like, partly related to the hot weather during the summer.  He does enjoy hiking and plans to be more this when there is more cooler weather. Most recent hemoglobin A1c did show slight increase and is within prediabetes range at 5.8%.  We will proceed with labs today for continued monitoring

## 2021-08-24 LAB — HEPATIC FUNCTION PANEL
ALT: 46 IU/L — ABNORMAL HIGH (ref 0–44)
AST: 35 IU/L (ref 0–40)
Albumin: 4.2 g/dL (ref 3.9–4.9)
Alkaline Phosphatase: 58 IU/L (ref 44–121)
Bilirubin Total: 0.5 mg/dL (ref 0.0–1.2)
Bilirubin, Direct: 0.18 mg/dL (ref 0.00–0.40)
Total Protein: 7 g/dL (ref 6.0–8.5)

## 2021-08-24 LAB — TSH+FREE T4
Free T4: 0.92 ng/dL (ref 0.82–1.77)
TSH: 3.57 u[IU]/mL (ref 0.450–4.500)

## 2021-08-24 LAB — T3: T3, Total: 124 ng/dL (ref 71–180)

## 2021-08-24 LAB — HEMOGLOBIN A1C
Est. average glucose Bld gHb Est-mCnc: 117 mg/dL
Hgb A1c MFr Bld: 5.7 % — ABNORMAL HIGH (ref 4.8–5.6)

## 2021-12-17 ENCOUNTER — Ambulatory Visit (INDEPENDENT_AMBULATORY_CARE_PROVIDER_SITE_OTHER): Payer: 59 | Admitting: Family Medicine

## 2021-12-17 ENCOUNTER — Encounter (HOSPITAL_BASED_OUTPATIENT_CLINIC_OR_DEPARTMENT_OTHER): Payer: Self-pay | Admitting: Family Medicine

## 2021-12-17 VITALS — BP 137/86 | HR 71 | Ht 71.0 in | Wt 250.2 lb

## 2021-12-17 DIAGNOSIS — G8929 Other chronic pain: Secondary | ICD-10-CM | POA: Diagnosis not present

## 2021-12-17 DIAGNOSIS — M545 Low back pain, unspecified: Secondary | ICD-10-CM | POA: Diagnosis not present

## 2021-12-17 MED ORDER — MELOXICAM 7.5 MG PO TABS: 7.5000 mg | ORAL_TABLET | Freq: Every day | ORAL | 0 refills | Status: DC

## 2021-12-17 MED ORDER — CYCLOBENZAPRINE HCL 5 MG PO TABS: 5.0000 mg | ORAL_TABLET | Freq: Three times a day (TID) | ORAL | 1 refills | Status: AC | PRN

## 2021-12-17 NOTE — Progress Notes (Signed)
    Procedures performed today:    None.  Independent interpretation of notes and tests performed by another provider:   None.  Brief History, Exam, Impression, and Recommendations:    BP 137/86 (BP Location: Left Arm, Patient Position: Sitting, Cuff Size: Large)   Pulse 71   Ht '5\' 11"'$  (1.803 m)   Wt 250 lb 3.2 oz (113.5 kg)   SpO2 100%   BMI 34.90 kg/m   Chronic low back pain Patient reports issues with chronic, intermittent low back pain.  Most recent episode started about 10 days ago.  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  Pain will be worsened with activity, particularly with walking at work or lifting heavier objects/packages.  He did have x-rays couple years ago and was told about arthritis and degenerative changes in his back.  He has utilized OTC medications.  Has not worked with physical therapy previously. On exam, patient is in no acute distress Lumbar spine: Visual inspection without obvious abnormality No tenderness to palpation over spinous processes, mild tenderness palpation through paraspinal musculature bilaterally, left worse than right Normal forward flexion, no pain elicited.  Normal back extension, no pain elicited. Normal trunk rotation range of motion bilaterally.  Normal gait in the office. Recommend proceeding with conservative measures.  We can proceed with trial of meloxicam, Flexeril.  Discussed precautions and potential side effects related to medications. Also recommend further evaluation management with physical therapy, referral placed today Plan for follow-up in about 6 to 8 weeks or sooner as needed.  If not improving as expected, consider updated x-rays, advanced imaging.  Also discussed possibility of working with spine specialist related to procedural interventions  Return in about 6 weeks (around  01/28/2022).   ___________________________________________ Terrel Nesheiwat de [place], MD, ABFM, CAQSM [facility] [place]

## 2021-12-17 NOTE — Patient Instructions (Signed)
  Medication Instructions:  Your physician recommends that you continue on your current medications as directed. Please refer to the Current Medication list given to you today. --If you need a refill on any your medications before your next appointment, please call your pharmacy first. If no refills are authorized on file call the office.-- Lab Work: Your physician has recommended that you have lab work today: No If you have labs (blood work) drawn today and your tests are completely normal, you will receive your results via [facility] a phone call from our staff.  Please ensure you check your voicemail in the event that you authorized detailed messages to be left on a delegated number. If you have any lab test that is abnormal or we need to change your treatment, we will call you to review the results.  Referrals/Procedures/Imaging: Yes, Physical Therapy  Follow-Up: Your next appointment:   Your physician recommends that you schedule a follow-up appointment in: 6-8 weeks with Dr. de [place].  You will receive a text message or e-mail with a link to a survey about your care and experience with [place] today! We would greatly appreciate your feedback!   Thanks for letting us be apart of your health journey!!  Primary Care and Sports Medicine   Dr. Arlina Robes [place]   We encourage you to activate your patient portal called "MyChart".  Sign up information is provided on this After Visit Summary.  MyChart is used to connect with patients for Virtual Visits (Telemedicine).  Patients are able to view lab/test results, encounter notes, upcoming appointments, etc.  Non-urgent messages can be sent to your provider as well. To learn more about what you can do with MyChart, please visit --  [email or web address].

## 2021-12-17 NOTE — Assessment & Plan Note (Signed)
Patient reports issues with chronic, intermittent low back pain.  Most recent episode started about 10 days ago.  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  Pain will be worsened with activity, particularly with walking at work or lifting heavier objects/packages.  He did have x-rays couple years ago and was told about arthritis and degenerative changes in his back.  He has utilized OTC medications.  Has not worked with physical therapy previously. On exam, patient is in no acute distress Lumbar spine: Visual inspection without obvious abnormality No tenderness to palpation over spinous processes, mild tenderness palpation through paraspinal musculature bilaterally, left worse than right Normal forward flexion, no pain elicited.  Normal back extension, no pain elicited. Normal trunk rotation range of motion bilaterally.  Normal gait in the office. Recommend proceeding with conservative measures.  We can proceed with trial of meloxicam, Flexeril.  Discussed precautions and potential side effects related to medications. Also recommend further evaluation management with physical therapy, referral placed today Plan for follow-up in about 6 to 8 weeks or sooner as needed.  If not improving as expected, consider updated x-rays, advanced imaging.  Also discussed possibility of working with spine specialist related to procedural interventions

## 2021-12-21 ENCOUNTER — Other Ambulatory Visit: Payer: Self-pay

## 2021-12-21 ENCOUNTER — Ambulatory Visit: Payer: 59 | Attending: Family Medicine

## 2021-12-21 DIAGNOSIS — M5459 Other low back pain: Secondary | ICD-10-CM | POA: Diagnosis present

## 2021-12-21 DIAGNOSIS — M545 Low back pain, unspecified: Secondary | ICD-10-CM | POA: Diagnosis not present

## 2021-12-21 DIAGNOSIS — G8929 Other chronic pain: Secondary | ICD-10-CM | POA: Diagnosis not present

## 2021-12-21 NOTE — Therapy (Signed)
OUTPATIENT PHYSICAL THERAPY THORACOLUMBAR EVALUATION   Patient Name: Travis Zuniga MRN: [id number] DOB:[birth year], 61 y.o., male Today's Date: 12/21/2021  END OF SESSION:  PT End of Session - 12/21/21 0901     Visit Number 1    Number of Visits 4    Date for PT Re-Evaluation 01/18/22    PT Start Time 0902    PT Stop Time 0945    PT Time Calculation (min) 43 min    Activity Tolerance Patient tolerated treatment well    Behavior During Therapy [facility] for tasks assessed/performed             Past Medical History:  Diagnosis Date   Allergy    seasonal allergies   Arthritis    lower back    GERD (gastroesophageal reflux disease)    with certain foods/prior to weight loss/uses OTC meds PRN   Hx of adenomatous polyp of colon 01/04/2020   diminutive   Subclinical hypothyroidism 08/23/2021   Past Surgical History:  Procedure Laterality Date   COLONOSCOPY  01/05/2020   LUNG SURGERY N/A 1961   lung surgery-lung collapsed-   VASECTOMY N/A    WISDOM TOOTH EXTRACTION     Patient Active Problem List   Diagnosis Date Noted   Chronic low back pain 12/17/2021   Prediabetes 08/23/2021   Subclinical hypothyroidism 08/23/2021   Hepatic steatosis 08/23/2021   Wellness examination 03/08/2021   Positive ANA (antinuclear antibody) 01/17/2021   Obesity, Class I, BMI 30-34.9 01/17/2021   Hx of adenomatous polyp of colon 01/04/2020   Class 2 obesity due to excess calories without serious comorbidity with body mass index (BMI) of 35.0 to 35.9 in adult 09/20/2019   Tinnitus of both ears 09/20/2019   REFERRING PROVIDER: de [place], Raymond J, MD   REFERRING DIAG: Chronic bilateral low back pain without sciatica   Rationale for Evaluation and Treatment: Rehabilitation  THERAPY DIAG:  Other low back pain  ONSET DATE: 7 years ago with acute flareup about 2 weeks ago  SUBJECTIVE:                                                                                                                                                                                            SUBJECTIVE STATEMENT: Patient reports that his back has been bothering him for about 7 years while he was working in his garden. He had been going to a chiropractor about every three weeks. He notes that his back gave out on his at work and the chiropractor helped some on Monday and Tuesday, but it started getting worse on Sunday. However, since that flare up he had  been going multiple times the past week. He began to have some sciatic pain down his right leg a few weeks ago, but this has not happened recently.   PERTINENT HISTORY:  Obesity, allergies, and arthritis  PAIN:  Are you having pain? Yes: NPRS scale: 8/10 Pain location: low back Pain description: spasms, sharp, sore, ache, constant Aggravating factors: standing, transfers, walking Relieving factors: chiropractor, ice, and medication  PRECAUTIONS: None  WEIGHT BEARING RESTRICTIONS: No  FALLS:  Has patient fallen in last 6 months? 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  PLOF: Independent  PATIENT GOALS: reduced pain, be able to hike (he has to go a lot slower than normal) and walk, and work without pain  NEXT MD VISIT: 02/07/22  OBJECTIVE:   PATIENT SURVEYS:  FOTO 47.44  SCREENING FOR RED FLAGS: Bowel or bladder incontinence: No Spinal tumors: No Cauda equina syndrome: No Compression fracture: No Abdominal aneurysm: No  COGNITION: Overall cognitive status: Within functional limits for tasks assessed     SENSATION: Patient reports no numbness or tingling  POSTURE: forward head and flexed trunk   PALPATION: No tenderness to palpation  LUMBAR ROM:   AROM eval  Flexion 74  Extension 18  Right lateral flexion WFL    Left lateral flexion WFL   Right rotation 25% limited  Left rotation 25% limited   (Blank rows = not tested)  LOWER EXTREMITY ROM: WFL for activities assessed   LOWER EXTREMITY MMT:    MMT Right eval Left eval  Hip flexion 4+/5 4+/5  Hip extension    Hip abduction    Hip adduction    Hip internal rotation    Hip external rotation    Knee flexion 4+/5 5/5  Knee extension 5/5 5/5  Ankle dorsiflexion 4/5 4/5  Ankle plantarflexion    Ankle inversion    Ankle eversion     (Blank rows = not tested)  LUMBAR SPECIAL TESTS:  Straight leg raise test: Negative  GAIT: Assistive device utilized: None Level of assistance: Complete Independence Comments: no significant gait deviations  TODAY'S TREATMENT:                                                                                                                              DATE:                                     12/15 EXERCISE LOG  Exercise Repetitions and Resistance Comments  Bridge  15 reps    Lower trunk rotation  15 reps    Single knee to chest  2 x 30 seconds  Blank cell = exercise not performed today   PATIENT EDUCATION:  Education details: HEP, prognosis, plan of care, healing, and patient agreed to hold chiropractic care until physical therapy is completed Person educated: Patient Education method: Explanation Education comprehension: verbalized understanding  HOME EXERCISE PROGRAM: [id number]  ASSESSMENT:  CLINICAL IMPRESSION: Patient is a 61 y.o. male who was seen today for physical therapy evaluation and treatment for chronic low back pain with an acute flareup approximately 2 weeks ago.  He presented with low pain severity.  However, none of today's assessments significantly reproduced his familiar pain.  He was provided an HEP which she was able to properly demonstrate and he reported feeling comfortable with these interventions.  Recommend that he continue with skilled physical therapy to  address his impairments to return to his prior level of function.  OBJECTIVE IMPAIRMENTS: decreased mobility, decreased ROM, decreased strength, and pain.   ACTIVITY LIMITATIONS: carrying, lifting, stairs, transfers, and locomotion level  PARTICIPATION LIMITATIONS: community activity and occupation  PERSONAL FACTORS: Time since onset of injury/illness/exacerbation and 3+ comorbidities: Obesity, allergies, and arthritis  are also affecting patient's functional outcome.   REHAB POTENTIAL: Fair    CLINICAL DECISION MAKING: Evolving/moderate complexity  EVALUATION COMPLEXITY: Moderate   GOALS: Goals reviewed with patient? Yes  LONG TERM GOALS: Target date: 01/18/22  Patient will be independent with his HEP. Baseline:  Goal status: INITIAL  2.  Patient will be able to complete his daily activities without his familiar pain exceeding 5/10. Baseline:  Goal status: INITIAL  3.  Patient will be able to lift at least 20 pounds without being limited by his familiar low back pain. Baseline:  Goal status: INITIAL  PLAN:  PT FREQUENCY: 1x/week  PT DURATION: 4 weeks  PLANNED INTERVENTIONS: Therapeutic exercises, Therapeutic activity, Neuromuscular re-education, Balance training, Patient/Family education, Joint mobilization, Stair training, Dry Needling, Electrical stimulation, Spinal manipulation, Spinal mobilization, Cryotherapy, Moist heat, Traction, Manual therapy, and Re-evaluation.  PLAN FOR NEXT SESSION: NuStep, lumbar and lower extremity strengthening, lifting mechanics, and modalities as needed   Darlin Coco, PT 12/21/2021, 1:02 PM

## 2021-12-27 ENCOUNTER — Ambulatory Visit: Payer: 59 | Admitting: Physical Therapy

## 2021-12-27 ENCOUNTER — Encounter: Payer: Self-pay | Admitting: Physical Therapy

## 2021-12-27 DIAGNOSIS — M5459 Other low back pain: Secondary | ICD-10-CM | POA: Diagnosis not present

## 2021-12-27 NOTE — Patient Instructions (Signed)

## 2021-12-27 NOTE — Therapy (Signed)
OUTPATIENT PHYSICAL THERAPY THORACOLUMBAR EVALUATION   Patient Name: Travis Zuniga MRN: [id number] DOB:[birth year], 61 y.o., male Today's Date: 12/27/2021  END OF SESSION:  PT End of Session - 12/27/21 1436     Visit Number 2    Number of Visits 4    Date for PT Re-Evaluation 01/18/22    PT Start Time 1432    PT Stop Time 1514    PT Time Calculation (min) 42 min    Activity Tolerance Patient tolerated treatment well    Behavior During Therapy [facility] for tasks assessed/performed            Past Medical History:  Diagnosis Date   Allergy    seasonal allergies   Arthritis    lower back    GERD (gastroesophageal reflux disease)    with certain foods/prior to weight loss/uses OTC meds PRN   Hx of adenomatous polyp of colon 01/04/2020   diminutive   Subclinical hypothyroidism 08/23/2021   Past Surgical History:  Procedure Laterality Date   COLONOSCOPY  01/05/2020   LUNG SURGERY N/A 1961   lung surgery-lung collapsed-   VASECTOMY N/A    WISDOM TOOTH EXTRACTION     Patient Active Problem List   Diagnosis Date Noted   Chronic low back pain 12/17/2021   Prediabetes 08/23/2021   Subclinical hypothyroidism 08/23/2021   Hepatic steatosis 08/23/2021   Wellness examination 03/08/2021   Positive ANA (antinuclear antibody) 01/17/2021   Obesity, Class I, BMI 30-34.9 01/17/2021   Hx of adenomatous polyp of colon 01/04/2020   Class 2 obesity due to excess calories without serious comorbidity with body mass index (BMI) of 35.0 to 35.9 in adult 09/20/2019   Tinnitus of both ears 09/20/2019   REFERRING PROVIDER: de [place], Raymond J, MD   REFERRING DIAG: Chronic bilateral low back pain without sciatica   Rationale for Evaluation and Treatment: Rehabilitation  THERAPY DIAG:  Other low back pain  ONSET DATE: 7 years ago with acute flareup about 2 weeks ago  SUBJECTIVE:                                                                                                                                                                                            SUBJECTIVE STATEMENT: Has done good since last visit. Did fairly well Monday and Tuesday at work.  PERTINENT HISTORY:  Obesity, allergies, and arthritis  PAIN:  Are you having pain? Yes: NPRS scale: 0/10 Pain location: low back Pain description: spasms, sharp, sore, ache, constant Aggravating factors: standing, transfers, walking Relieving factors: chiropractor, ice, and medication  PRECAUTIONS: None  NEXT MD VISIT: 02/07/22  OBJECTIVE:  PATIENT SURVEYS:  FOTO 47.44  LUMBAR ROM:   AROM eval  Flexion 74  Extension 18  Right lateral flexion WFL   Left lateral flexion WFL   Right rotation 25% limited  Left rotation 25% limited   (Blank rows = not tested)  LOWER EXTREMITY ROM: WFL for activities assessed   LOWER EXTREMITY MMT:    MMT Right eval Left eval  Hip flexion 4+/5 4+/5  Hip extension    Hip abduction    Hip adduction    Hip internal rotation    Hip external rotation    Knee flexion 4+/5 5/5  Knee extension 5/5 5/5  Ankle dorsiflexion 4/5 4/5  Ankle plantarflexion    Ankle inversion    Ankle eversion     (Blank rows = not tested)  TODAY'S TREATMENT:                                                                                                                              DATE:                                     12/21 EXERCISE LOG  Exercise Repetitions and Resistance Comments  Nustep L3 x10 min   Lat pulldown Blue XTS x20 reps   Hip abduction AROM x20 reps   Hip extension AROM x20 reps   Bridge X20 reps 3 sec holds   Clam  Red theraband x20 reps    Blank cell = exercise not performed today   PATIENT EDUCATION:  Education details: posture/ADLS handout with TENS  Person educated: Patient Education method: Explanation, handout Education comprehension: verbalized understanding  HOME EXERCISE PROGRAM: [id number]  ASSESSMENT:  CLINICAL IMPRESSION: Patient presented  in clinic with no LBP. Patient progressed through therex with education regarding core activation, posture. Only hip discomfort with LLE SLS for R hip abduction. Patient reports compliance with HEP provided at evaluation. Patient provided posture/ADLs handout for education further of posture and ADLs in various enviroments such as work or home. No negative complaints following end of session.  OBJECTIVE IMPAIRMENTS: decreased mobility, decreased ROM, decreased strength, and pain.   ACTIVITY LIMITATIONS: carrying, lifting, stairs, transfers, and locomotion level  PARTICIPATION LIMITATIONS: community activity and occupation  PERSONAL FACTORS: Time since onset of injury/illness/exacerbation and 3+ comorbidities: Obesity, allergies, and arthritis  are also affecting patient's functional outcome.   REHAB POTENTIAL: Fair    CLINICAL DECISION MAKING: Evolving/moderate complexity  EVALUATION COMPLEXITY: Moderate  GOALS: Goals reviewed with patient? Yes  LONG TERM GOALS: Target date: 01/18/22  Patient will be independent with his HEP. Baseline:  Goal status: INITIAL  2.  Patient will be able to complete his daily activities without his familiar pain exceeding 5/10. Baseline:  Goal status: INITIAL  3.  Patient will be able to lift at least 20 pounds without being limited by his familiar low back pain. Baseline:  Goal status: INITIAL  PLAN:  PT FREQUENCY: 1x/week  PT DURATION: 4 weeks  PLANNED INTERVENTIONS: Therapeutic exercises, Therapeutic activity, Neuromuscular re-education, Balance training, Patient/Family education, Joint mobilization, Stair training, Dry Needling, Electrical stimulation, Spinal manipulation, Spinal mobilization, Cryotherapy, Moist heat, Traction, Manual therapy, and Re-evaluation.  PLAN FOR NEXT SESSION: NuStep, lumbar and lower extremity strengthening, lifting mechanics, and modalities as needed  Standley Brooking, PTA 12/27/2021, 3:20 PM

## 2022-01-04 ENCOUNTER — Ambulatory Visit: Payer: 59

## 2022-01-04 DIAGNOSIS — M5459 Other low back pain: Secondary | ICD-10-CM | POA: Diagnosis not present

## 2022-01-04 NOTE — Therapy (Signed)
OUTPATIENT PHYSICAL THERAPY THORACOLUMBAR EVALUATION   Patient Name: Travis Zuniga MRN: [id number] DOB:1960/07/23, 61 y.o., male Today's Date: 01/04/2022  END OF SESSION:  PT End of Session - 01/04/22 0819     Visit Number 3    Number of Visits 4    Date for PT Re-Evaluation 01/18/22    PT Start Time 0815    Activity Tolerance Patient tolerated treatment well    Behavior During Therapy [facility] for tasks assessed/performed            Past Medical History:  Diagnosis Date   Allergy    seasonal allergies   Arthritis    lower back    GERD (gastroesophageal reflux disease)    with certain foods/prior to weight loss/uses OTC meds PRN   Hx of adenomatous polyp of colon 01/04/2020   diminutive   Subclinical hypothyroidism 08/23/2021   Past Surgical History:  Procedure Laterality Date   COLONOSCOPY  01/05/2020   LUNG SURGERY N/A 1961   lung surgery-lung collapsed-   VASECTOMY N/A    WISDOM TOOTH EXTRACTION     Patient Active Problem List   Diagnosis Date Noted   Chronic low back pain 12/17/2021   Prediabetes 08/23/2021   Subclinical hypothyroidism 08/23/2021   Hepatic steatosis 08/23/2021   Wellness examination 03/08/2021   Positive ANA (antinuclear antibody) 01/17/2021   Obesity, Class I, BMI 30-34.9 01/17/2021   Hx of adenomatous polyp of colon 01/04/2020   Class 2 obesity due to excess calories without serious comorbidity with body mass index (BMI) of 35.0 to 35.9 in adult 09/20/2019   Tinnitus of both ears 09/20/2019   REFERRING PROVIDER: [place], Blondell Reveal, MD   REFERRING DIAG: Chronic bilateral low back pain without sciatica   Rationale for Evaluation and Treatment: Rehabilitation  THERAPY DIAG:  Other low back pain  ONSET DATE: 7 years ago with acute flareup about 2 weeks ago  SUBJECTIVE:                                                                                                                                                                                            SUBJECTIVE STATEMENT: Pt denies any pain today, reports that back is feeling much better, but he is having some right hamstring tightness today.   PERTINENT HISTORY:  Obesity, allergies, and arthritis  PAIN:  Are you having pain? Yes: NPRS scale: 0/10 Pain location: low back Pain description: spasms, sharp, sore, ache, constant Aggravating factors: standing, transfers, walking Relieving factors: chiropractor, ice, and medication  PRECAUTIONS: None  NEXT MD VISIT: 02/07/22  OBJECTIVE:   PATIENT SURVEYS:  FOTO 47.44  LUMBAR  ROM:   AROM eval  Flexion 74  Extension 18  Right lateral flexion WFL   Left lateral flexion WFL   Right rotation 25% limited  Left rotation 25% limited   (Blank rows = not tested)  LOWER EXTREMITY ROM: WFL for activities assessed   LOWER EXTREMITY MMT:    MMT Right eval Left eval  Hip flexion 4+/5 4+/5  Hip extension    Hip abduction    Hip adduction    Hip internal rotation    Hip external rotation    Knee flexion 4+/5 5/5  Knee extension 5/5 5/5  Ankle dorsiflexion 4/5 4/5  Ankle plantarflexion    Ankle inversion    Ankle eversion     (Blank rows = not tested)  TODAY'S TREATMENT:                                                                                                                              DATE:                                     12/29 EXERCISE LOG  Exercise Repetitions and Resistance Comments  Nustep L3 x16 min   Lat pulldown Blue XTS x25 reps   Hip abduction 2# x20 reps   Hip extension 2# x20 reps   Hip flexion 2# x20 reps   Bridge X25 reps 3 sec holds   Clam  Red theraband x25 reps    Blank cell = exercise not performed today   Manual Therapy Soft Tissue Mobilization: right hamstring, STW/M to right hamstring to decrease pain and tone with pt in prone for comfort     PATIENT EDUCATION:  Education details: posture/ADLS handout with TENS  Person educated: Patient Education method:  Explanation, handout Education comprehension: verbalized understanding  HOME EXERCISE PROGRAM: [id number]  ASSESSMENT:  CLINICAL IMPRESSION: Pt arrives for today's treatment session denying any pain, but does report right hamstring tightness.  Pt able to tolerate increased reps with all previously performed exercises.  Also able to tolerate increased weight weight with standing hip abductions and extensions today with min cues required to avoid compensatory leaning with each rep.  STW/M performed to right hamstring to decrease pain and tone with pt positioned in prone for comfort.  Pt denied any pain at completion of today's treatment session.  OBJECTIVE IMPAIRMENTS: decreased mobility, decreased ROM, decreased strength, and pain.   ACTIVITY LIMITATIONS: carrying, lifting, stairs, transfers, and locomotion level  PARTICIPATION LIMITATIONS: community activity and occupation  PERSONAL FACTORS: Time since onset of injury/illness/exacerbation and 3+ comorbidities: Obesity, allergies, and arthritis  are also affecting patient's functional outcome.   REHAB POTENTIAL: Fair    CLINICAL DECISION MAKING: Evolving/moderate complexity  EVALUATION COMPLEXITY: Moderate  GOALS: Goals reviewed with patient? Yes  LONG TERM GOALS: Target date: 01/18/22  Patient will be independent with his HEP. Baseline:  Goal status:  INITIAL  2.  Patient will be able to complete his daily activities without his familiar pain exceeding 5/10. Baseline:  Goal status: INITIAL  3.  Patient will be able to lift at least 20 pounds without being limited by his familiar low back pain. Baseline:  Goal status: INITIAL  PLAN:  PT FREQUENCY: 1x/week  PT DURATION: 4 weeks  PLANNED INTERVENTIONS: Therapeutic exercises, Therapeutic activity, Neuromuscular re-education, Balance training, Patient/Family education, Joint mobilization, Stair training, Dry Needling, Electrical stimulation, Spinal manipulation, Spinal  mobilization, Cryotherapy, Moist heat, Traction, Manual therapy, and Re-evaluation.  PLAN FOR NEXT SESSION: NuStep, lumbar and lower extremity strengthening, lifting mechanics, and modalities as needed  Kathrynn Ducking, PTA 01/04/2022, 9:10 AM

## 2022-01-14 ENCOUNTER — Ambulatory Visit: Payer: 59 | Attending: Family Medicine

## 2022-01-14 DIAGNOSIS — M5459 Other low back pain: Secondary | ICD-10-CM | POA: Insufficient documentation

## 2022-01-14 NOTE — Therapy (Addendum)
OUTPATIENT PHYSICAL THERAPY THORACOLUMBAR TREATMENT   Patient Name: Travis Zuniga MRN: [id number] DOB:[birth year], 62 y.o., male Today's Date: 01/14/2022  END OF SESSION:  PT End of Session - 01/14/22 0817     Visit Number 4    Number of Visits 4    Date for PT Re-Evaluation 01/18/22    Activity Tolerance Patient tolerated treatment well    Behavior During Therapy [facility] for tasks assessed/performed            Past Medical History:  Diagnosis Date   Allergy    seasonal allergies   Arthritis    lower back    GERD (gastroesophageal reflux disease)    with certain foods/prior to weight loss/uses OTC meds PRN   Hx of adenomatous polyp of colon 01/04/2020   diminutive   Subclinical hypothyroidism 08/23/2021   Past Surgical History:  Procedure Laterality Date   COLONOSCOPY  01/05/2020   LUNG SURGERY N/A 1961   lung surgery-lung collapsed-   VASECTOMY N/A    WISDOM TOOTH EXTRACTION     Patient Active Problem List   Diagnosis Date Noted   Chronic low back pain 12/17/2021   Prediabetes 08/23/2021   Subclinical hypothyroidism 08/23/2021   Hepatic steatosis 08/23/2021   Wellness examination 03/08/2021   Positive ANA (antinuclear antibody) 01/17/2021   Obesity, Class I, BMI 30-34.9 01/17/2021   Hx of adenomatous polyp of colon 01/04/2020   Class 2 obesity due to excess calories without serious comorbidity with body mass index (BMI) of 35.0 to 35.9 in adult 09/20/2019   Tinnitus of both ears 09/20/2019   REFERRING PROVIDER: [place], Raymond J, MD   REFERRING DIAG: Chronic bilateral low back pain without sciatica   Rationale for Evaluation and Treatment: Rehabilitation  THERAPY DIAG:  Other low back pain  ONSET DATE: 7 years ago with acute flareup about 2 weeks ago  SUBJECTIVE:                                                                                                                                                                                            SUBJECTIVE STATEMENT: Pt denies any pain, but does report mild right hamstring tightness.  Ready for discharge today.   PERTINENT HISTORY:  Obesity, allergies, and arthritis  PAIN:  Are you having pain? Yes: NPRS scale: 0/10 Pain location: low back Pain description: spasms, sharp, sore, ache, constant Aggravating factors: standing, transfers, walking Relieving factors: chiropractor, ice, and medication  PRECAUTIONS: None  NEXT MD VISIT: 02/07/22  OBJECTIVE:   PATIENT SURVEYS:  FOTO 47.44  LUMBAR ROM:   AROM eval  Flexion 74  Extension 18  Right lateral flexion WFL   Left lateral flexion WFL   Right rotation 25% limited  Left rotation 25% limited   (Blank rows = not tested)  LOWER EXTREMITY ROM: WFL for activities assessed   LOWER EXTREMITY MMT:    MMT Right eval Left eval  Hip flexion 4+/5 4+/5  Hip extension    Hip abduction    Hip adduction    Hip internal rotation    Hip external rotation    Knee flexion 4+/5 5/5  Knee extension 5/5 5/5  Ankle dorsiflexion 4/5 4/5  Ankle plantarflexion    Ankle inversion    Ankle eversion     (Blank rows = not tested)  TODAY'S TREATMENT:                                                                                                                              DATE:                                     1/8 EXERCISE LOG  Exercise Repetitions and Resistance Comments  Nustep L5 x16 min   Lat pulldown Orange XTS x20 reps   Hip abduction 3# x25 reps   Hip extension 3# x25 reps   Hip flexion 3# x25 reps   Cybex Knee Flexion 40# x 30 reps   Cybex Knee Extension 10# x 30 reps    Blank cell = exercise not performed today   Manual Therapy Soft Tissue Mobilization: right hamstring, STW/M to right hamstring to decrease pain and tone with pt in prone for comfort     PATIENT EDUCATION:  Education details: posture/ADLS handout with TENS  Person educated: Patient Education method: Explanation, handout Education  comprehension: verbalized understanding  HOME EXERCISE PROGRAM: [id number]  ASSESSMENT:  CLINICAL IMPRESSION: Pt arrives for today's treatment session denying any pain, but does endorse mild right hamstring tightness.  Pt able to increase FOTO score to 67 today.  Pt able to tolerate increased resistance, weight, and reps with all previously performed exercises today.  Pt able to tolerate introduction Cybex knee flexion and extension without issue or complaint of discomfort.  Pt has met all goals set forth for him at this time and is ready for discharge at this time.   PHYSICAL THERAPY DISCHARGE SUMMARY  Visits from Start of Care: 4  Current functional level related to goals / functional outcomes: Patient was able to meet all of his goals for skilled physical therapy.    Remaining deficits: None    Education / Equipment: HEP    Patient agrees to discharge. Patient goals were met. Patient is being discharged due to meeting the stated rehab goals.  Jacqulynn Cadet, PT, DPT    OBJECTIVE IMPAIRMENTS: decreased mobility, decreased ROM, decreased strength, and pain.   ACTIVITY LIMITATIONS: carrying, lifting, stairs, transfers, and locomotion level  PARTICIPATION LIMITATIONS: community activity and  occupation  PERSONAL FACTORS: Time since onset of injury/illness/exacerbation and 3+ comorbidities: Obesity, allergies, and arthritis  are also affecting patient's functional outcome.   REHAB POTENTIAL: Fair    CLINICAL DECISION MAKING: Evolving/moderate complexity  EVALUATION COMPLEXITY: Moderate  GOALS: Goals reviewed with patient? Yes  LONG TERM GOALS: Target date: 01/18/22  Patient will be independent with his HEP. Baseline:  Goal status: MET  2.  Patient will be able to complete his daily activities without his familiar pain exceeding 5/10. Baseline:  Goal status: MET  3.  Patient will be able to lift at least 20 pounds without being limited by his familiar low back  pain. Baseline:  Goal status: MET  PLAN:  PT FREQUENCY: 1x/week  PT DURATION: 4 weeks  PLANNED INTERVENTIONS: Therapeutic exercises, Therapeutic activity, Neuromuscular re-education, Balance training, Patient/Family education, Joint mobilization, Stair training, Dry Needling, Electrical stimulation, Spinal manipulation, Spinal mobilization, Cryotherapy, Moist heat, Traction, Manual therapy, and Re-evaluation.  PLAN FOR NEXT SESSION: NuStep, lumbar and lower extremity strengthening, lifting mechanics, and modalities as needed  Kathrynn Ducking, PTA 01/14/2022, 9:02 AM

## 2022-02-07 ENCOUNTER — Ambulatory Visit (HOSPITAL_BASED_OUTPATIENT_CLINIC_OR_DEPARTMENT_OTHER): Payer: 59 | Admitting: Family Medicine

## 2022-03-06 ENCOUNTER — Ambulatory Visit (HOSPITAL_BASED_OUTPATIENT_CLINIC_OR_DEPARTMENT_OTHER): Payer: 59

## 2022-03-06 DIAGNOSIS — Z0000 Encounter for general adult medical examination without abnormal findings: Secondary | ICD-10-CM

## 2022-03-07 LAB — CBC WITH DIFFERENTIAL/PLATELET
Basophils Absolute: 0.1 10*3/uL (ref 0.0–0.2)
Basos: 1 %
EOS (ABSOLUTE): 0.1 10*3/uL (ref 0.0–0.4)
Eos: 1 %
Hematocrit: 44 % (ref 37.5–51.0)
Hemoglobin: 15.2 g/dL (ref 13.0–17.7)
Immature Grans (Abs): 0 10*3/uL (ref 0.0–0.1)
Immature Granulocytes: 0 %
Lymphocytes Absolute: 2.8 10*3/uL (ref 0.7–3.1)
Lymphs: 38 %
MCH: 32.8 pg (ref 26.6–33.0)
MCHC: 34.5 g/dL (ref 31.5–35.7)
MCV: 95 fL (ref 79–97)
Monocytes Absolute: 0.6 10*3/uL (ref 0.1–0.9)
Monocytes: 8 %
Neutrophils Absolute: 3.8 10*3/uL (ref 1.4–7.0)
Neutrophils: 52 %
Platelets: 194 10*3/uL (ref 150–450)
RBC: 4.64 x10E6/uL (ref 4.14–5.80)
RDW: 11.9 % (ref 11.6–15.4)
WBC: 7.4 10*3/uL (ref 3.4–10.8)

## 2022-03-07 LAB — COMPREHENSIVE METABOLIC PANEL
ALT: 57 IU/L — ABNORMAL HIGH (ref 0–44)
AST: 41 IU/L — ABNORMAL HIGH (ref 0–40)
Albumin/Globulin Ratio: 1.3 (ref 1.2–2.2)
Albumin: 3.9 g/dL (ref 3.9–4.9)
Alkaline Phosphatase: 61 IU/L (ref 44–121)
BUN/Creatinine Ratio: 11 (ref 10–24)
BUN: 10 mg/dL (ref 8–27)
Bilirubin Total: 0.6 mg/dL (ref 0.0–1.2)
CO2: 20 mmol/L (ref 20–29)
Calcium: 9.2 mg/dL (ref 8.6–10.2)
Chloride: 103 mmol/L (ref 96–106)
Creatinine, Ser: 0.9 mg/dL (ref 0.76–1.27)
Globulin, Total: 3 g/dL (ref 1.5–4.5)
Glucose: 106 mg/dL — ABNORMAL HIGH (ref 70–99)
Potassium: 4.3 mmol/L (ref 3.5–5.2)
Sodium: 138 mmol/L (ref 134–144)
Total Protein: 6.9 g/dL (ref 6.0–8.5)
eGFR: 96 mL/min/{1.73_m2} (ref >59)

## 2022-03-07 LAB — LIPID PANEL
Chol/HDL Ratio: 3.3 ratio (ref 0.0–5.0)
Cholesterol, Total: 176 mg/dL (ref 100–199)
HDL: 54 mg/dL (ref >39)
LDL Chol Calc (NIH): 108 mg/dL — ABNORMAL HIGH (ref 0–99)
Triglycerides: 74 mg/dL (ref 0–149)
VLDL Cholesterol Cal: 14 mg/dL (ref 5–40)

## 2022-03-07 LAB — HEMOGLOBIN A1C
Est. average glucose Bld gHb Est-mCnc: 120 mg/dL
Hgb A1c MFr Bld: 5.8 % — ABNORMAL HIGH (ref 4.8–5.6)

## 2022-03-13 ENCOUNTER — Encounter (HOSPITAL_BASED_OUTPATIENT_CLINIC_OR_DEPARTMENT_OTHER): Payer: Self-pay | Admitting: Family Medicine

## 2022-03-13 ENCOUNTER — Ambulatory Visit (INDEPENDENT_AMBULATORY_CARE_PROVIDER_SITE_OTHER): Payer: 59 | Admitting: Family Medicine

## 2022-03-13 VITALS — BP 124/83 | HR 59 | Temp 98.6°F | Ht 71.0 in | Wt 251.7 lb

## 2022-03-13 DIAGNOSIS — Z0000 Encounter for general adult medical examination without abnormal findings: Secondary | ICD-10-CM

## 2022-03-13 NOTE — Assessment & Plan Note (Signed)
Routine HCM labs reviewed. HCM reviewed/discussed. Anticipatory guidance regarding healthy weight, lifestyle and choices given. Recommend healthy diet.  Recommend approximately 150 minutes/week of moderate intensity exercise Recommend regular dental and vision exams Always use seatbelt/lap and shoulder restraints Recommend using smoke alarms and checking batteries at least twice a year Recommend using sunscreen when outside Discussed colon cancer screening recommendations, options.  Patient is UTD Discussed recommendations for shingles vaccine.  Patient declines at this time, will consider Discussed tetanus immunization recommendations, patient is UTD

## 2022-03-13 NOTE — Progress Notes (Signed)
Subjective:    CC: Annual Physical Exam  HPI:  Travis Zuniga is a 62 y.o. presenting for annual physical  I reviewed the past medical history, family history, social history, surgical history, and allergies today and no changes were needed.  Please see the problem list section below in epic for further details.  Past Medical History: Past Medical History:  Diagnosis Date   Allergy    seasonal allergies   Arthritis    lower back    GERD (gastroesophageal reflux disease)    with certain foods/prior to weight loss/uses OTC meds PRN   Hx of adenomatous polyp of colon 01/04/2020   diminutive   Subclinical hypothyroidism 08/23/2021   Past Surgical History: Past Surgical History:  Procedure Laterality Date   COLONOSCOPY  01/05/2020   LUNG SURGERY N/A 1961   lung surgery-lung collapsed-   VASECTOMY N/A    WISDOM TOOTH EXTRACTION     Social History: Social History   Socioeconomic History   Marital status: Married    Spouse name: Not on file   Number of children: Not on file   Years of education: Not on file   Highest education level: Not on file  Occupational History    Employer: [employer]  Tobacco Use   Smoking status: Never   Smokeless tobacco: Never  Vaping Use   Vaping Use: Never used  Substance and Sexual Activity   Alcohol use: Yes    Comment: a 12 pack per month   Drug use: Never   Sexual activity: Yes    Birth control/protection: None  Other Topics Concern   Not on file  Social History Narrative   Not on file   Social Determinants of Health   Financial Resource Strain: Not on file  Food Insecurity: Not on file  Transportation Needs: Not on file  Physical Activity: Not on file  Stress: Not on file  Social Connections: Not on file   Family History: Family History  Problem Relation Age of Onset   Heart disease Mother        rheumatic fever as child   Stroke Mother        Smoker   Alcohol abuse Mother    Miscarriages / [place] Mother     Alcohol abuse Father    Liver disease Father        Possibly   Arthritis Sister    Diabetes Brother    Liver disease Brother        NASH   Heart disease Paternal Grandfather        MI   Diabetes Brother    Colon polyps Brother    Healthy Sister    Colon cancer Neg Hx    Esophageal cancer Neg Hx    Rectal cancer Neg Hx    Stomach cancer Neg Hx    Allergies: No Known Allergies Medications: See med rec.  Review of Systems: No headache, visual changes, nausea, vomiting, diarrhea, constipation, dizziness, abdominal pain, skin rash, fevers, chills, night sweats, swollen lymph nodes, weight loss, chest pain, body aches, joint swelling, muscle aches, shortness of breath, mood changes, visual or auditory hallucinations.  Objective:    BP 124/83   Pulse (!) 59   Temp 98.6 F (37 C) (Oral)   Ht '5\' 11"'$  (1.803 m)   Wt 251 lb 11.2 oz (114.2 kg)   SpO2 99%   BMI 35.11 kg/m   General: Well Developed, well nourished, and in no acute distress. Neuro: Alert and oriented x3, extra-ocular  muscles intact, sensation grossly intact. Cranial nerves II through XII are intact, motor, sensory, and coordinative functions are all intact. HEENT: Normocephalic, atraumatic, pupils equal round reactive to light, neck supple, no masses, no lymphadenopathy, thyroid nonpalpable. Oropharynx, nasopharynx, external ear canals are unremarkable. Skin: Warm and dry, no rashes noted. Cardiac: Regular rate and rhythm, no murmurs rubs or gallops. Respiratory: Clear to auscultation bilaterally. Not using accessory muscles, speaking in full sentences. Abdominal: Soft, nontender, nondistended, positive bowel sounds, no masses, no organomegaly. Musculoskeletal: Shoulder, elbow, wrist, hip, knee, ankle stable, and with full range of motion.  Impression and Recommendations:    Wellness examination Routine HCM labs reviewed. HCM reviewed/discussed. Anticipatory guidance regarding healthy weight, lifestyle and choices  given. Recommend healthy diet.  Recommend approximately 150 minutes/week of moderate intensity exercise Recommend regular dental and vision exams Always use seatbelt/lap and shoulder restraints Recommend using smoke alarms and checking batteries at least twice a year Recommend using sunscreen when outside Discussed colon cancer screening recommendations, options.  Patient is UTD Discussed recommendations for shingles vaccine.  Patient declines at this time, will consider Discussed tetanus immunization recommendations, patient is UTD  Patient does report some right leg pain extending from upper posterior thigh down the back of her leg towards the calf.  History suggestive of sciatica.  He does report having issues with this in the past.  He is currently been managing with conservative measures and home exercises.  We discussed consideration for referral for physical therapy, he would like to hold off on this for now.  If he does elect to proceed with this, recommend that he contact the clinic and we can place referral at any time  Return in about 6 months (around 09/13/2022) for PreDM, subclinical hypothyroidism.   ___________________________________________ [name], MD, ABFM, CAQSM Primary Care and [place]

## 2022-05-28 ENCOUNTER — Encounter (HOSPITAL_BASED_OUTPATIENT_CLINIC_OR_DEPARTMENT_OTHER): Payer: Self-pay | Admitting: Family Medicine

## 2022-06-12 ENCOUNTER — Ambulatory Visit (HOSPITAL_BASED_OUTPATIENT_CLINIC_OR_DEPARTMENT_OTHER): Payer: 59 | Admitting: Family Medicine

## 2022-06-13 ENCOUNTER — Encounter (HOSPITAL_BASED_OUTPATIENT_CLINIC_OR_DEPARTMENT_OTHER): Payer: Self-pay | Admitting: Family Medicine

## 2022-06-13 ENCOUNTER — Ambulatory Visit (INDEPENDENT_AMBULATORY_CARE_PROVIDER_SITE_OTHER): Payer: 59 | Admitting: Family Medicine

## 2022-06-13 VITALS — BP 140/102 | HR 66 | Ht 71.0 in | Wt 249.0 lb

## 2022-06-13 DIAGNOSIS — R361 Hematospermia: Secondary | ICD-10-CM | POA: Diagnosis not present

## 2022-06-13 LAB — POCT URINALYSIS DIPSTICK
Bilirubin, UA: NEGATIVE
Blood, UA: NEGATIVE
Glucose, UA: NEGATIVE
Ketones, UA: NEGATIVE
Leukocytes, UA: NEGATIVE
Nitrite, UA: NEGATIVE
Protein, UA: NEGATIVE
Spec Grav, UA: >=1.03 — AB (ref 1.010–1.025)
Urobilinogen, UA: 0.2 E.U./dL
pH, UA: 5.5 (ref 5.0–8.0)

## 2022-06-13 MED ORDER — MELOXICAM 7.5 MG PO TABS: 7.5000 mg | ORAL_TABLET | Freq: Every day | ORAL | 0 refills | Status: DC

## 2022-06-13 NOTE — Assessment & Plan Note (Signed)
Patient presents for evaluation of changes in semen appearance.  He reports that he noticed red/brown discoloration in semen, started about a couple months ago. No prior similar issues. No urinary symptoms. No abdominal pain. No fever, chills, weight changes. On exam, vital signs stable, patient is in no acute distress. Given symptoms, duration, feel that further evaluation is warranted.  Initial urine dip completed in office today which was generally reassuring, evidence of dehydration observed, however no hematuria, pyuria, bacteriuria, proteinuria.  Feel that further evaluation with specialist is warranted, referral to urologist placed today.

## 2022-06-13 NOTE — Progress Notes (Signed)
    Procedures performed today:    None.  Independent interpretation of notes and tests performed by another provider:   None.  Brief History, Exam, Impression, and Recommendations:    BP (!) 140/102 (BP Location: Left Arm, Patient Position: Sitting, Cuff Size: Normal)   Pulse 66   Ht 5\' 11"  (1.803 m)   Wt 249 lb (112.9 kg)   SpO2 99%   BMI 34.73 kg/m   Hematospermia Patient presents for evaluation of changes in semen appearance.  He reports that he noticed red/brown discoloration in semen, started about a couple months ago. No prior similar issues. No urinary symptoms. No abdominal pain. No fever, chills, weight changes. On exam, vital signs stable, patient is in no acute distress. Given symptoms, duration, feel that further evaluation is warranted.  Initial urine dip completed in office today which was generally reassuring, evidence of dehydration observed, however no hematuria, pyuria, bacteriuria, proteinuria.  Feel that further evaluation with specialist is warranted, referral to urologist placed today.   ___________________________________________ Vincenza Dail de [place], MD, ABFM, CAQSM Primary Care and Sports Medicine [facility]

## 2022-06-26 ENCOUNTER — Encounter (HOSPITAL_BASED_OUTPATIENT_CLINIC_OR_DEPARTMENT_OTHER): Payer: Self-pay | Admitting: Family Medicine

## 2022-09-13 ENCOUNTER — Encounter (HOSPITAL_BASED_OUTPATIENT_CLINIC_OR_DEPARTMENT_OTHER): Payer: Self-pay | Admitting: Family Medicine

## 2022-09-13 ENCOUNTER — Ambulatory Visit (HOSPITAL_BASED_OUTPATIENT_CLINIC_OR_DEPARTMENT_OTHER): Payer: 59 | Admitting: Family Medicine

## 2022-09-13 VITALS — BP 142/99 | HR 58 | Ht 71.0 in | Wt 249.0 lb

## 2022-09-13 DIAGNOSIS — R361 Hematospermia: Secondary | ICD-10-CM

## 2022-09-13 DIAGNOSIS — Z0000 Encounter for general adult medical examination without abnormal findings: Secondary | ICD-10-CM

## 2022-09-13 DIAGNOSIS — R7303 Prediabetes: Secondary | ICD-10-CM | POA: Diagnosis not present

## 2022-09-13 DIAGNOSIS — Z125 Encounter for screening for malignant neoplasm of prostate: Secondary | ICD-10-CM

## 2022-09-13 DIAGNOSIS — E038 Other specified hypothyroidism: Secondary | ICD-10-CM

## 2022-09-13 DIAGNOSIS — R7401 Elevation of levels of liver transaminase levels: Secondary | ICD-10-CM | POA: Insufficient documentation

## 2022-09-13 NOTE — Assessment & Plan Note (Signed)
Patient has had elevated liver enzymes in the past.  He would like to have recheck of labs completed.  Can proceed with this today

## 2022-09-13 NOTE — Assessment & Plan Note (Signed)
Patient had evaluation with urologist.  Reportedly, they were not overly concerned with reported symptoms and observed gradual improvement.  He indicates that they did recommend checking PSA with next lab draw which he would like to do today. We can proceed with check of PSA today

## 2022-09-13 NOTE — Progress Notes (Signed)
    Procedures performed today:    None.  Independent interpretation of notes and tests performed by another provider:   None.  Brief History, Exam, Impression, and Recommendations:    BP (!) 142/99   Pulse (!) 58   Ht 5\' 11"  (1.803 m)   Wt 249 lb (112.9 kg)   SpO2 99%   BMI 34.73 kg/m   Hematospermia Assessment & Plan: Patient had evaluation with urologist.  Reportedly, they were not overly concerned with reported symptoms and observed gradual improvement.  He indicates that they did recommend checking PSA with next lab draw which he would like to do today. We can proceed with check of PSA today  Orders: -     PSA Total (Reflex To Free)  Prediabetes Assessment & Plan: Last hemoglobin A1c was within prediabetes range at 5.8%.  Patient primarily focusing on lifestyle modifications.  No new symptoms such as polyuria or polydipsia. Will proceed with check of hemoglobin A1c today for monitoring  Orders: -     Hemoglobin A1c  Prostate cancer screening -     PSA Total (Reflex To Free)  Subclinical hypothyroidism Assessment & Plan: Previously, patient did have very slight elevation in TSH, most recently had normal TSH, free T4 and T3.  Labs last checked about 1 year ago.  Does not report any new symptoms which would suggest progression to overt hypothyroidism. We will proceed with recheck of thyroid function testing today  Orders: -     TSH + free T4  Transaminitis Assessment & Plan: Patient has had elevated liver enzymes in the past.  He would like to have recheck of labs completed.  Can proceed with this today  Orders: -     Comprehensive metabolic panel  Return in about 6 months (around 03/13/2023) for CPE with fasting labs 1 week prior.   ___________________________________________ Philippe Gang [place], MD, ABFM, CAQSM Primary Care and Sports Medicine [facility]

## 2022-09-13 NOTE — Assessment & Plan Note (Signed)
Previously, patient did have very slight elevation in TSH, most recently had normal TSH, free T4 and T3.  Labs last checked about 1 year ago.  Does not report any new symptoms which would suggest progression to overt hypothyroidism. We will proceed with recheck of thyroid function testing today

## 2022-09-13 NOTE — Assessment & Plan Note (Signed)
Last hemoglobin A1c was within prediabetes range at 5.8%.  Patient primarily focusing on lifestyle modifications.  No new symptoms such as polyuria or polydipsia. Will proceed with check of hemoglobin A1c today for monitoring

## 2022-09-14 LAB — COMPREHENSIVE METABOLIC PANEL
ALT: 37 IU/L (ref 0–44)
AST: 28 IU/L (ref 0–40)
Albumin: 4 g/dL (ref 3.9–4.9)
Alkaline Phosphatase: 60 IU/L (ref 44–121)
BUN/Creatinine Ratio: 15 (ref 10–24)
BUN: 14 mg/dL (ref 8–27)
Bilirubin Total: 0.5 mg/dL (ref 0.0–1.2)
CO2: 24 mmol/L (ref 20–29)
Calcium: 9.3 mg/dL (ref 8.6–10.2)
Chloride: 104 mmol/L (ref 96–106)
Creatinine, Ser: 0.95 mg/dL (ref 0.76–1.27)
Globulin, Total: 3 g/dL (ref 1.5–4.5)
Glucose: 107 mg/dL — ABNORMAL HIGH (ref 70–99)
Potassium: 5.3 mmol/L — ABNORMAL HIGH (ref 3.5–5.2)
Sodium: 139 mmol/L (ref 134–144)
Total Protein: 7 g/dL (ref 6.0–8.5)
eGFR: 89 mL/min/{1.73_m2} (ref >59)

## 2022-09-14 LAB — HEMOGLOBIN A1C
Est. average glucose Bld gHb Est-mCnc: 128 mg/dL
Hgb A1c MFr Bld: 6.1 % — ABNORMAL HIGH (ref 4.8–5.6)

## 2022-09-14 LAB — TSH+FREE T4
Free T4: 0.84 ng/dL (ref 0.82–1.77)
TSH: 7.78 u[IU]/mL — ABNORMAL HIGH (ref 0.450–4.500)

## 2022-09-14 LAB — PSA TOTAL (REFLEX TO FREE): Prostate Specific Ag, Serum: 1.2 ng/mL (ref 0.0–4.0)

## 2022-09-19 ENCOUNTER — Other Ambulatory Visit (HOSPITAL_BASED_OUTPATIENT_CLINIC_OR_DEPARTMENT_OTHER): Payer: Self-pay | Admitting: Family Medicine

## 2022-09-19 DIAGNOSIS — E875 Hyperkalemia: Secondary | ICD-10-CM

## 2022-09-20 ENCOUNTER — Telehealth (HOSPITAL_BASED_OUTPATIENT_CLINIC_OR_DEPARTMENT_OTHER): Payer: Self-pay | Admitting: Family Medicine

## 2022-09-20 ENCOUNTER — Other Ambulatory Visit (HOSPITAL_BASED_OUTPATIENT_CLINIC_OR_DEPARTMENT_OTHER): Payer: Self-pay | Admitting: Family Medicine

## 2022-09-20 DIAGNOSIS — E039 Hypothyroidism, unspecified: Secondary | ICD-10-CM

## 2022-09-20 MED ORDER — LEVOTHYROXINE SODIUM 25 MCG PO TABS: 25.0000 ug | ORAL_TABLET | Freq: Every day | ORAL | 1 refills | Status: DC

## 2022-09-20 NOTE — Progress Notes (Signed)
Spoke with patient regarding recent lab results.  TSH is above 7 at this point.  We discussed risk and benefits pertaining to treatment of subclinical hypothyroidism given free T4 is still within normal range, however at very low end of normal range.  After discussion, patient elects to proceed with levothyroxine, we will start on low-dose and monitor progress.  Plan for recheck of TSH in 6 weeks.

## 2022-09-20 NOTE — Addendum Note (Signed)
Addended by: DE [place], Forrest Jaroszewski J on: 09/20/2022 02:33 PM   Modules accepted: Orders

## 2022-09-23 NOTE — Telephone Encounter (Signed)
See note under order encounter

## 2022-10-18 ENCOUNTER — Encounter (HOSPITAL_BASED_OUTPATIENT_CLINIC_OR_DEPARTMENT_OTHER): Payer: Self-pay | Admitting: Family Medicine

## 2022-11-12 ENCOUNTER — Other Ambulatory Visit (HOSPITAL_BASED_OUTPATIENT_CLINIC_OR_DEPARTMENT_OTHER): Payer: 59

## 2022-11-12 DIAGNOSIS — E875 Hyperkalemia: Secondary | ICD-10-CM

## 2022-11-12 DIAGNOSIS — Z0000 Encounter for general adult medical examination without abnormal findings: Secondary | ICD-10-CM

## 2022-11-12 DIAGNOSIS — E039 Hypothyroidism, unspecified: Secondary | ICD-10-CM

## 2022-11-13 ENCOUNTER — Other Ambulatory Visit (HOSPITAL_BASED_OUTPATIENT_CLINIC_OR_DEPARTMENT_OTHER): Payer: Self-pay | Admitting: Family Medicine

## 2022-11-13 DIAGNOSIS — E039 Hypothyroidism, unspecified: Secondary | ICD-10-CM

## 2022-11-13 LAB — COMPREHENSIVE METABOLIC PANEL
ALT: 48 [IU]/L — ABNORMAL HIGH (ref 0–44)
AST: 35 [IU]/L (ref 0–40)
Albumin: 3.9 g/dL (ref 3.9–4.9)
Alkaline Phosphatase: 69 [IU]/L (ref 44–121)
BUN/Creatinine Ratio: 10 (ref 10–24)
BUN: 9 mg/dL (ref 8–27)
Bilirubin Total: 0.5 mg/dL (ref 0.0–1.2)
CO2: 24 mmol/L (ref 20–29)
Calcium: 9.6 mg/dL (ref 8.6–10.2)
Chloride: 101 mmol/L (ref 96–106)
Creatinine, Ser: 0.87 mg/dL (ref 0.76–1.27)
Globulin, Total: 3.2 g/dL (ref 1.5–4.5)
Glucose: 110 mg/dL — ABNORMAL HIGH (ref 70–99)
Potassium: 4.4 mmol/L (ref 3.5–5.2)
Sodium: 137 mmol/L (ref 134–144)
Total Protein: 7.1 g/dL (ref 6.0–8.5)
eGFR: 96 mL/min/{1.73_m2} (ref >59)

## 2022-11-13 LAB — TSH RFX ON ABNORMAL TO FREE T4: TSH: 6.76 u[IU]/mL — ABNORMAL HIGH (ref 0.450–4.500)

## 2022-11-13 LAB — LIPID PANEL
Chol/HDL Ratio: 3.1 ratio (ref 0.0–5.0)
Cholesterol, Total: 191 mg/dL (ref 100–199)
HDL: 61 mg/dL (ref >39)
LDL Chol Calc (NIH): 116 mg/dL — ABNORMAL HIGH (ref 0–99)
Triglycerides: 77 mg/dL (ref 0–149)
VLDL Cholesterol Cal: 14 mg/dL (ref 5–40)

## 2022-11-13 LAB — CBC WITH DIFFERENTIAL/PLATELET
Basophils Absolute: 0 10*3/uL (ref 0.0–0.2)
Basos: 0 %
EOS (ABSOLUTE): 0.2 10*3/uL (ref 0.0–0.4)
Eos: 3 %
Hematocrit: 46.2 % (ref 37.5–51.0)
Hemoglobin: 15.6 g/dL (ref 13.0–17.7)
Immature Grans (Abs): 0 10*3/uL (ref 0.0–0.1)
Immature Granulocytes: 0 %
Lymphocytes Absolute: 2.5 10*3/uL (ref 0.7–3.1)
Lymphs: 34 %
MCH: 33.2 pg — ABNORMAL HIGH (ref 26.6–33.0)
MCHC: 33.8 g/dL (ref 31.5–35.7)
MCV: 98 fL — ABNORMAL HIGH (ref 79–97)
Monocytes Absolute: 0.6 10*3/uL (ref 0.1–0.9)
Monocytes: 8 %
Neutrophils Absolute: 4 10*3/uL (ref 1.4–7.0)
Neutrophils: 55 %
Platelets: 205 10*3/uL (ref 150–450)
RBC: 4.7 x10E6/uL (ref 4.14–5.80)
RDW: 12.1 % (ref 11.6–15.4)
WBC: 7.4 10*3/uL (ref 3.4–10.8)

## 2022-11-13 LAB — HEMOGLOBIN A1C
Est. average glucose Bld gHb Est-mCnc: 123 mg/dL
Hgb A1c MFr Bld: 5.9 % — ABNORMAL HIGH (ref 4.8–5.6)

## 2022-11-13 LAB — T4F: T4,Free (Direct): 0.97 ng/dL (ref 0.82–1.77)

## 2022-11-13 LAB — TSH

## 2022-11-13 MED ORDER — LEVOTHYROXINE SODIUM 50 MCG PO TABS: 50.0000 ug | ORAL_TABLET | Freq: Every day | ORAL | 1 refills | Status: DC

## 2022-11-14 ENCOUNTER — Encounter (HOSPITAL_BASED_OUTPATIENT_CLINIC_OR_DEPARTMENT_OTHER): Payer: Self-pay | Admitting: *Deleted

## 2023-01-03 ENCOUNTER — Other Ambulatory Visit (HOSPITAL_BASED_OUTPATIENT_CLINIC_OR_DEPARTMENT_OTHER): Payer: 59

## 2023-01-03 ENCOUNTER — Other Ambulatory Visit (HOSPITAL_BASED_OUTPATIENT_CLINIC_OR_DEPARTMENT_OTHER): Payer: Self-pay | Admitting: Family Medicine

## 2023-01-03 ENCOUNTER — Encounter (HOSPITAL_BASED_OUTPATIENT_CLINIC_OR_DEPARTMENT_OTHER): Payer: Self-pay

## 2023-01-04 LAB — TSH: TSH: 3.38 u[IU]/mL (ref 0.450–4.500)

## 2023-02-02 IMAGING — US US ABDOMEN LIMITED
1 series · 14 of 25 positions shown · non-contrast
Comparison: None.

CLINICAL DATA: Elevated liver enzymes.

EXAM:
ULTRASOUND ABDOMEN LIMITED RIGHT UPPER QUADRANT

[Series 1: us abdomen limited · 0.22mm/px · 14 of 57 slices shown]
[im 1/57]
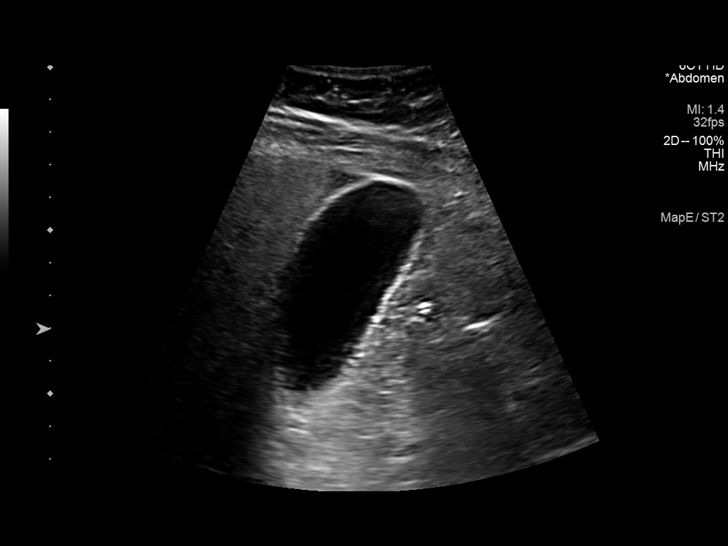
[im 5/57]
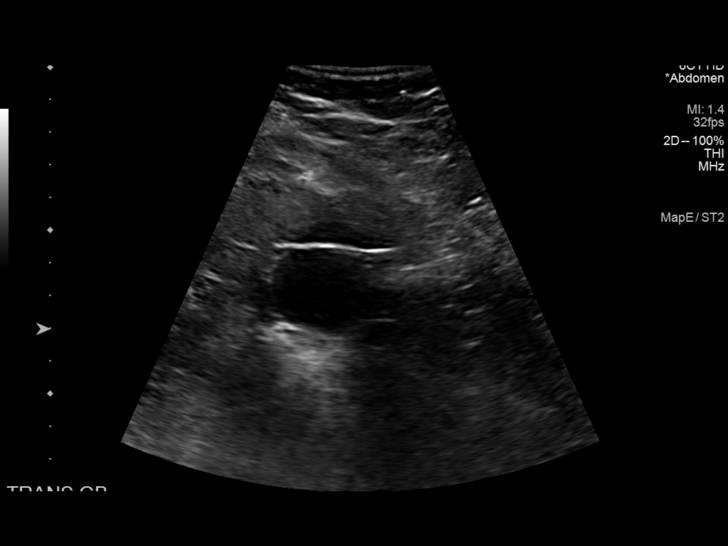
[im 10/57]
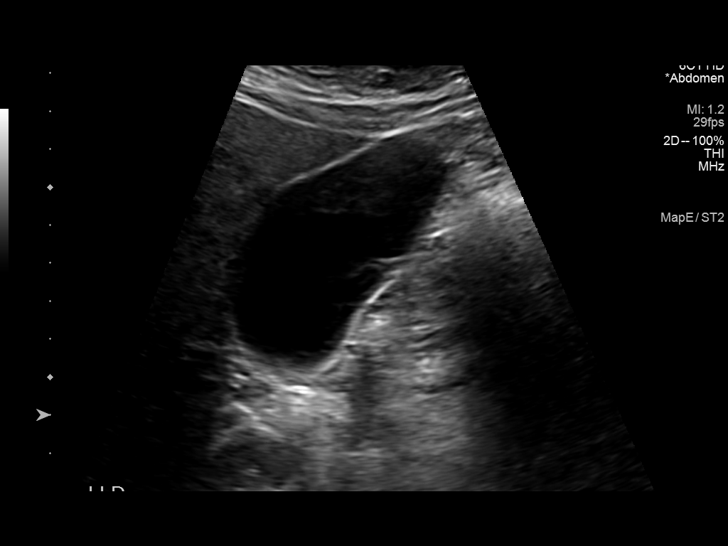
[im 15/57]
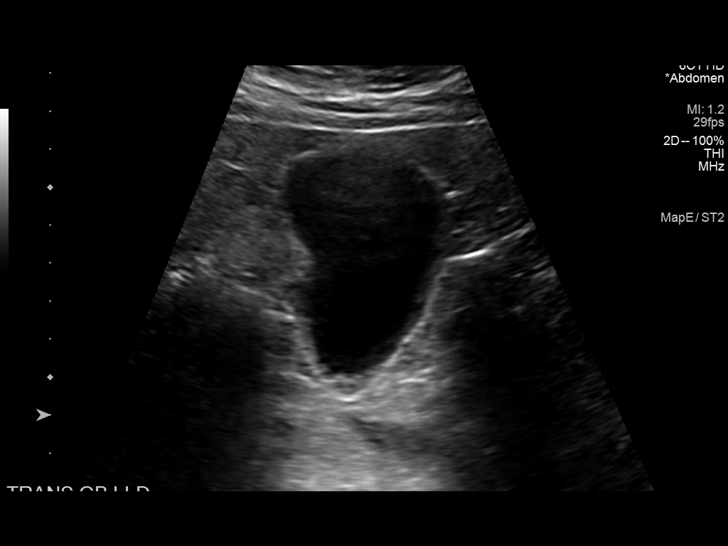
[im 19/57]
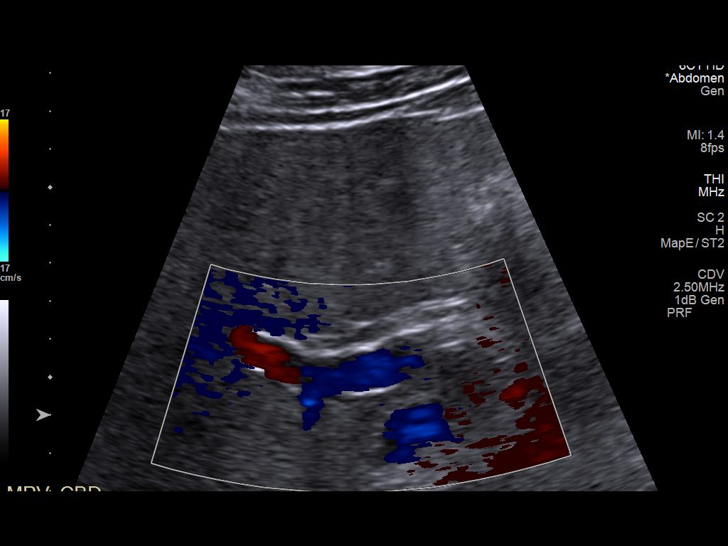
[im 22/57]
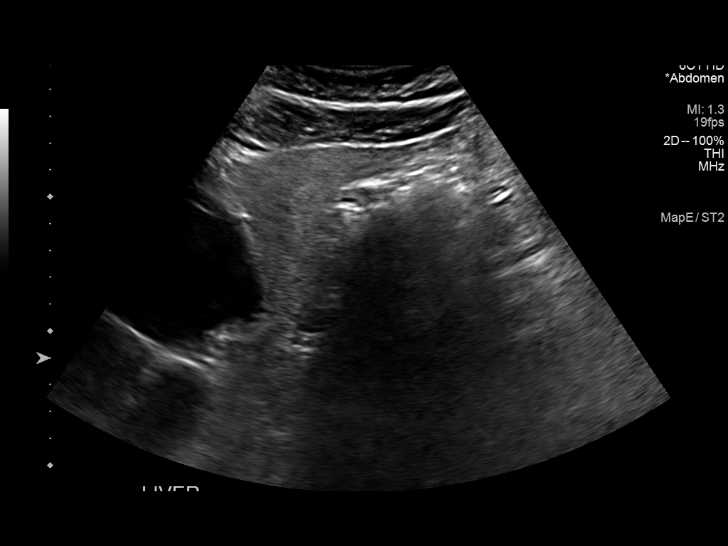
[im 26/57]
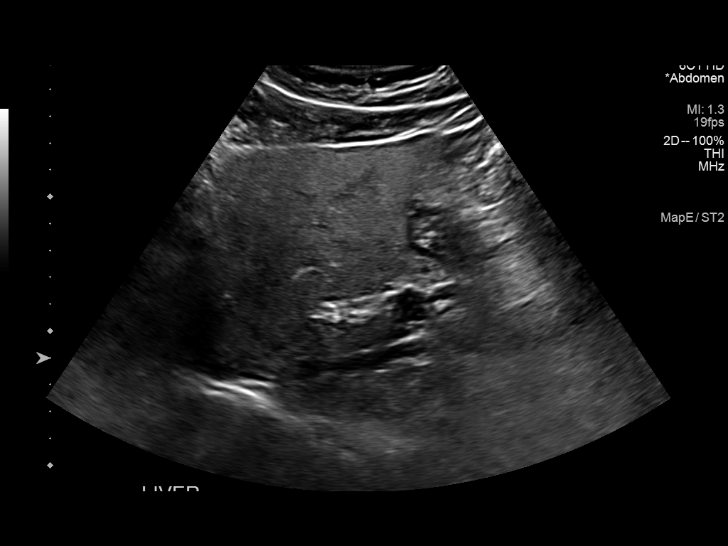
[im 31/57]
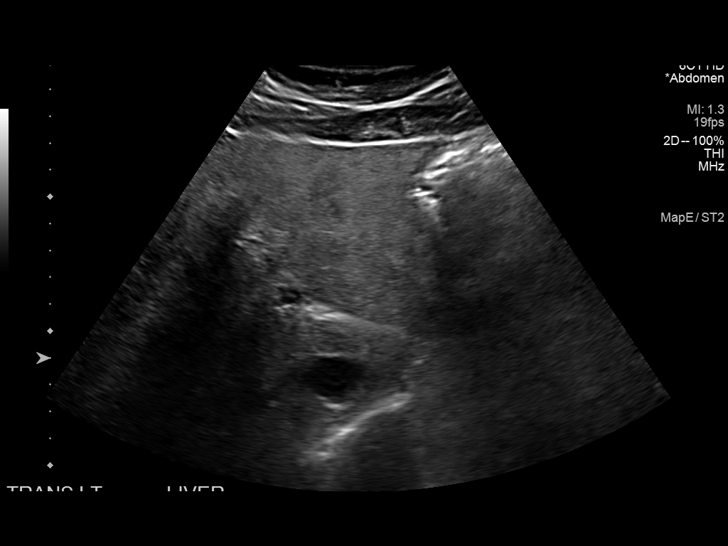
[im 36/57]
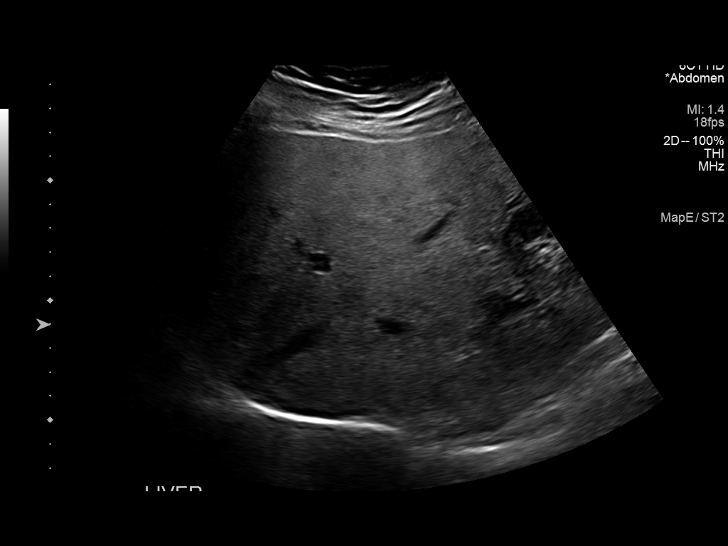
[im 38/57]
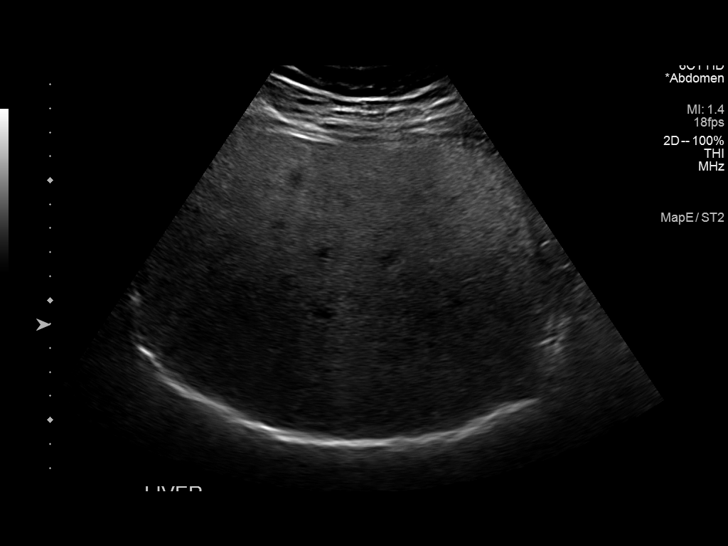
[im 43/57]
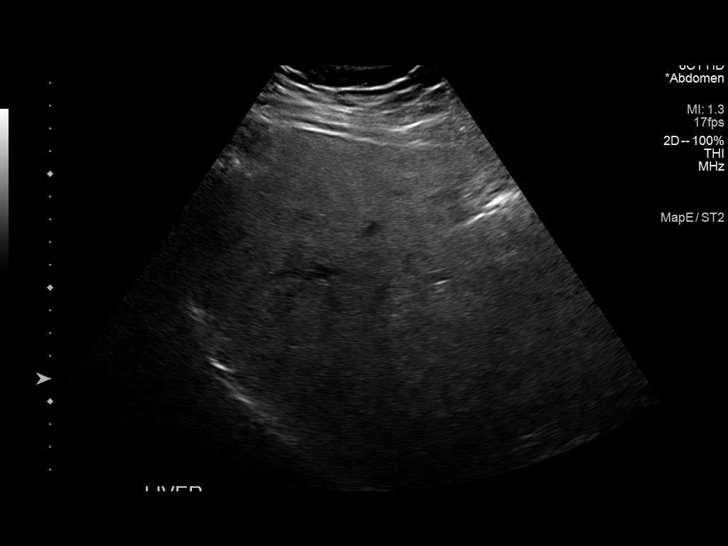
[im 47/57]
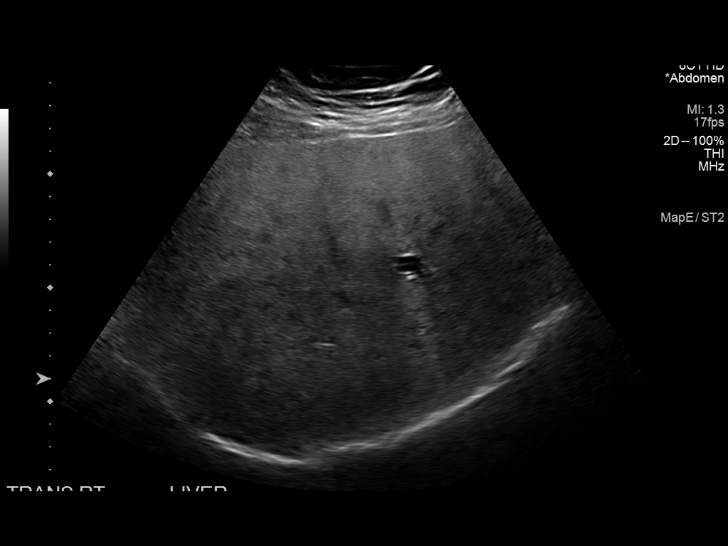
[im 52/57]
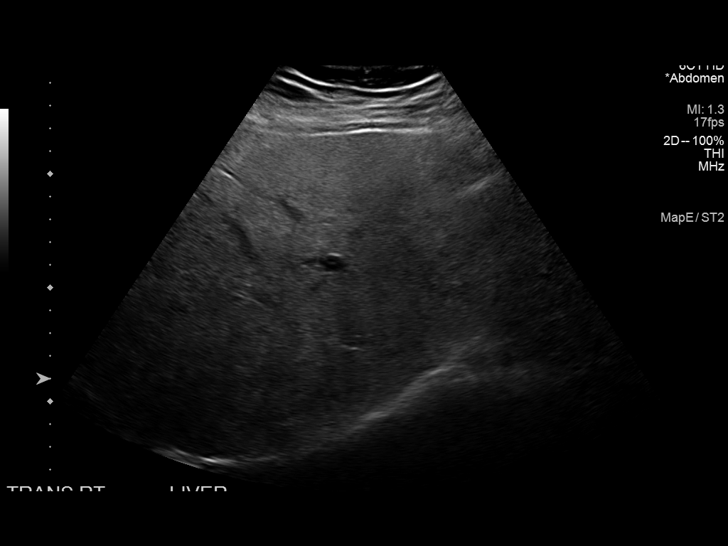
[im 57/57]
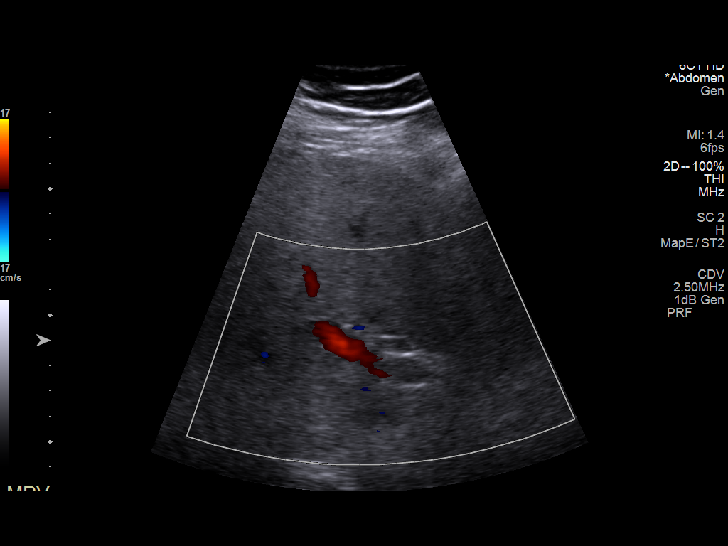

[14 of 25 positions shown; findings below may reference images not displayed]

FINDINGS: Gallbladder:

No gallstones or wall thickening visualized. No sonographic Murphy
sign noted by sonographer.

Common bile duct:

Diameter: 5 mm which is within normal limits.

Liver:

Increased echogenicity of hepatic parenchyma is noted suggesting
hepatic steatosis. Portal vein is patent on color Doppler imaging
with normal direction of blood flow towards the liver.

Other: None.
IMPRESSION: Hepatic steatosis. No other definite abnormality seen in the right
upper quadrant of the abdomen.

## 2023-03-13 ENCOUNTER — Ambulatory Visit (HOSPITAL_BASED_OUTPATIENT_CLINIC_OR_DEPARTMENT_OTHER): Payer: 59 | Admitting: Family Medicine

## 2023-03-13 ENCOUNTER — Encounter (HOSPITAL_BASED_OUTPATIENT_CLINIC_OR_DEPARTMENT_OTHER): Payer: Self-pay | Admitting: Family Medicine

## 2023-03-13 VITALS — BP 131/90 | HR 62 | Ht 71.0 in | Wt 244.0 lb

## 2023-03-13 DIAGNOSIS — E039 Hypothyroidism, unspecified: Secondary | ICD-10-CM

## 2023-03-13 DIAGNOSIS — R7303 Prediabetes: Secondary | ICD-10-CM | POA: Diagnosis not present

## 2023-03-13 DIAGNOSIS — Z0000 Encounter for general adult medical examination without abnormal findings: Secondary | ICD-10-CM

## 2023-03-13 MED ORDER — LEVOTHYROXINE SODIUM 50 MCG PO TABS: 50.0000 ug | ORAL_TABLET | Freq: Every day | ORAL | 1 refills | Status: DC

## 2023-03-13 MED ORDER — MELOXICAM 7.5 MG PO TABS: 7.5000 mg | ORAL_TABLET | Freq: Every day | ORAL | 0 refills | Status: DC

## 2023-03-13 NOTE — Assessment & Plan Note (Signed)
 Continues with lifestyle modifications, hemoglobin A1c has remained stable on prior evaluation.  Recommend continuing with lifestyle modification, monitoring for any new symptoms such as polyuria or polydipsia.  Will plan to check hemoglobin A1c show before next appointment

## 2023-03-13 NOTE — Assessment & Plan Note (Signed)
 Continues with levothyroxine.  No concerns at this time.  Most recent TSH was within normal range.  Patient without any current symptoms, clinically euthyroid. Can continue with current dose of levothyroxine.  Will plan to recheck TSH shortly before next appointment

## 2023-03-13 NOTE — Progress Notes (Signed)
    Procedures performed today:    None.  Independent interpretation of notes and tests performed by another provider:   None.  Brief History, Exam, Impression, and Recommendations:    BP (!) 131/90 (BP Location: Left Arm, Patient Position: Sitting, Cuff Size: Large)   Pulse 62   Ht 5\' 11"  (1.803 m)   Wt 244 lb (110.7 kg)   SpO2 100%   BMI 34.03 kg/m   Hypothyroidism, unspecified type Assessment & Plan: Continues with levothyroxine.  No concerns at this time.  Most recent TSH was within normal range.  Patient without any current symptoms, clinically euthyroid. Can continue with current dose of levothyroxine.  Will plan to recheck TSH shortly before next appointment   Wellness examination -     CBC with Differential/Platelet; Future -     Comprehensive metabolic panel; Future -     Hemoglobin A1c; Future -     Lipid panel; Future -     TSH; Future  Prediabetes Assessment & Plan: Continues with lifestyle modifications, hemoglobin A1c has remained stable on prior evaluation.  Recommend continuing with lifestyle modification, monitoring for any new symptoms such as polyuria or polydipsia.  Will plan to check hemoglobin A1c show before next appointment   Other orders -     Levothyroxine Sodium; Take 1 tablet (50 mcg total) by mouth daily.  Dispense: 90 tablet; Refill: 1 -     Meloxicam; Take 1 tablet (7.5 mg total) by mouth daily.  Dispense: 30 tablet; Refill: 0  Return in about 6 months (around 09/13/2023) for CPE with fasting labs 1 week prior.   ___________________________________________ Rupa Lagan de Peru, MD, ABFM, CAQSM Primary Care and Sports Medicine [facility]

## 2023-03-13 NOTE — Patient Instructions (Signed)
  Medication Instructions:  Your physician recommends that you continue on your current medications as directed. Please refer to the Current Medication list given to you today. --If you need a refill on any your medications before your next appointment, please call your pharmacy first. If no refills are authorized on file call the office.-- Lab Work: Your physician has recommended that you have lab work today: labs 1 week prior to appointment If you have labs (blood work) drawn today and your tests are completely normal, you will receive your results via MyChart message OR a phone call from our staff.  Please ensure you check your voicemail in the event that you authorized detailed messages to be left on a delegated number. If you have any lab test that is abnormal or we need to change your treatment, we will call you to review the results.    Follow-Up: Your next appointment:   Your physician recommends that you schedule a follow-up appointment in: 6 mths physical with Dr. de Peru  You will receive a text message or e-mail with a link to a survey about your care and experience with [place] today! We would greatly appreciate your feedback!   Thanks for letting us be apart of your health journey!!  Primary Care and Sports Medicine   Dr. Ceasar Mons Peru   We encourage you to activate your patient portal called "MyChart".  Sign up information is provided on this After Visit Summary.  MyChart is used to connect with patients for Virtual Visits (Telemedicine).  Patients are able to view lab/test results, encounter notes, upcoming appointments, etc.  Non-urgent messages can be sent to your provider as well. To learn more about what you can do with MyChart, please visit --  [email or web address].

## 2023-08-05 ENCOUNTER — Other Ambulatory Visit (HOSPITAL_BASED_OUTPATIENT_CLINIC_OR_DEPARTMENT_OTHER): Payer: Self-pay | Admitting: Family Medicine

## 2023-08-21 ENCOUNTER — Ambulatory Visit (HOSPITAL_BASED_OUTPATIENT_CLINIC_OR_DEPARTMENT_OTHER): Admitting: Family Medicine

## 2023-08-21 VITALS — BP 146/94 | HR 80 | Temp 97.8°F | Ht 71.0 in | Wt 248.8 lb

## 2023-08-21 DIAGNOSIS — Z0000 Encounter for general adult medical examination without abnormal findings: Secondary | ICD-10-CM

## 2023-08-21 DIAGNOSIS — G8929 Other chronic pain: Secondary | ICD-10-CM | POA: Diagnosis not present

## 2023-08-21 DIAGNOSIS — M25512 Pain in left shoulder: Secondary | ICD-10-CM | POA: Insufficient documentation

## 2023-08-21 NOTE — Progress Notes (Signed)
    Procedures performed today:    None.  Independent interpretation of notes and tests performed by another provider:   None.  Brief History, Exam, Impression, and Recommendations:    BP (!) 146/94 (BP Location: Left Arm, Patient Position: Sitting, Cuff Size: Normal)   Pulse 80   Temp 97.8 F (36.6 C) (Oral)   Ht 5' 11 (1.803 m)   Wt 248 lb 12.8 oz (112.9 kg)   BMI 34.70 kg/m   Discussed the use of AI scribe software for clinical note transcription with the patient, who gave verbal consent to proceed.  History of Present Illness Travis Zuniga is a 63 year old male who presents with left shoulder pain.  He has been experiencing left shoulder pain since the winter, with no specific incident of injury. The pain is intermittent and can become severe, particularly at night, disrupting his sleep. Movement tends to alleviate the pain, but it can be intense after a day of repetitive motions at work.  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 It is primarily located at the back of his left shoulder and is described as 'screaming' at times. No numbness or tingling is associated with the pain, but there is some weakness when lifting objects.  He has a history of right shoulder issues from several years ago after a fall on ice, which was evaluated with x-rays showing no tears or fractures. The current left shoulder pain began in the winter and has persisted through the spring and summer, with periods of improvement when not working for a few days.  He uses ibuprofen occasionally for the pain and has a prescription for meloxicam  for back issues, which he uses sparingly. Arnica cream and pills have been tried with uncertain effectiveness. Meloxicam  does not seem to improve the shoulder pain, as it is taken only during back pain flare-ups.  He maintains a good range of motion in the left shoulder but experiences  pain with certain movements, such as reaching out or lifting. Shoulder exercises have not been helpful.  On exam, Left shoulder: Obvious swelling, bruising or erythema: absent Deformity of the shoulder: absent Active ROM: full range of motion Passive ROM: full range of motion Strength: normal/normal Empty can: Positive Hawkins: Positive Neer's: Negative Neurovascular exam: intact  Chronic left shoulder pain Assessment & Plan: Left shoulder pain likely rotator cuff tendinopathy, possible degenerative tearing and bursitis Chronic left shoulder pain with good range of motion, likely due to partial tearing or fraying of rotator cuff tendons and associated bursitis. - Provided handout with home exercises for shoulder pain. - Referred to physical therapy for evaluation and treatment. - Considered steroid injection if conservative measures are not effective. Consider XR if symptoms persist  Orders: -     Ambulatory referral to Physical Therapy  Wellness examination -     TSH -     Lipid panel -     Hemoglobin A1c -     Comprehensive metabolic panel with GFR -     CBC with Differential/Platelet  Return if symptoms worsen or fail to improve.   ___________________________________________ Travis Rieger de [place], MD, ABFM, CAQSM Primary Care and Sports Medicine [facility]

## 2023-08-21 NOTE — Patient Instructions (Signed)
  Medication Instructions:  Your physician recommends that you continue on your current medications as directed. Please refer to the Current Medication list given to you today. --If you need a refill on any your medications before your next appointment, please call your pharmacy first. If no refills are authorized on file call the office.-- Lab Work: Your physician has recommended that you have lab work today: yes If you have labs (blood work) drawn today and your tests are completely normal, you will receive your results via MyChart message OR a phone call from our staff.  Please ensure you check your voicemail in the event that you authorized detailed messages to be left on a delegated number. If you have any lab test that is abnormal or we need to change your treatment, we will call you to review the results.  Referrals/Procedures/Imaging: yes  Follow-Up: Your next appointment:   Your physician recommends that you schedule a follow-up appointment in: as scheduled for physical with Dr. de Peru  You will receive a text message or e-mail with a link to a survey about your care and experience with us  today! We would greatly appreciate your feedback!   Thanks for letting us  be apart of your health journey!!  Primary Care and Sports Medicine   Dr. Quintin sheerer Peru   We encourage you to activate your patient portal called MyChart.  Sign up information is provided on this After Visit Summary.  MyChart is used to connect with patients for Virtual Visits (Telemedicine).  Patients are able to view lab/test results, encounter notes, upcoming appointments, etc.  Non-urgent messages can be sent to your provider as well. To learn more about what you can do with MyChart, please visit --  [email or web address].

## 2023-08-21 NOTE — Assessment & Plan Note (Signed)
 Left shoulder pain likely rotator cuff tendinopathy, possible degenerative tearing and bursitis Chronic left shoulder pain with good range of motion, likely due to partial tearing or fraying of rotator cuff tendons and associated bursitis. - Provided handout with home exercises for shoulder pain. - Referred to physical therapy for evaluation and treatment. - Considered steroid injection if conservative measures are not effective. Consider XR if symptoms persist

## 2023-08-22 LAB — CBC WITH DIFFERENTIAL/PLATELET
Basophils Absolute: 0 x10E3/uL (ref 0.0–0.2)
Basos: 0 %
EOS (ABSOLUTE): 0.2 x10E3/uL (ref 0.0–0.4)
Eos: 2 %
Hematocrit: 45.8 % (ref 37.5–51.0)
Hemoglobin: 15 g/dL (ref 13.0–17.7)
Immature Grans (Abs): 0 x10E3/uL (ref 0.0–0.1)
Immature Granulocytes: 0 %
Lymphocytes Absolute: 2.5 x10E3/uL (ref 0.7–3.1)
Lymphs: 34 %
MCH: 31.9 pg (ref 26.6–33.0)
MCHC: 32.8 g/dL (ref 31.5–35.7)
MCV: 97 fL (ref 79–97)
Monocytes Absolute: 0.6 x10E3/uL (ref 0.1–0.9)
Monocytes: 9 %
Neutrophils Absolute: 3.9 x10E3/uL (ref 1.4–7.0)
Neutrophils: 54 %
Platelets: 188 x10E3/uL (ref 150–450)
RBC: 4.7 x10E6/uL (ref 4.14–5.80)
RDW: 11.9 % (ref 11.6–15.4)
WBC: 7.2 x10E3/uL (ref 3.4–10.8)

## 2023-08-22 LAB — LIPID PANEL
Chol/HDL Ratio: 3.1 ratio (ref 0.0–5.0)
Cholesterol, Total: 171 mg/dL (ref 100–199)
HDL: 56 mg/dL (ref >39)
LDL Chol Calc (NIH): 100 mg/dL — ABNORMAL HIGH (ref 0–99)
Triglycerides: 81 mg/dL (ref 0–149)
VLDL Cholesterol Cal: 15 mg/dL (ref 5–40)

## 2023-08-22 LAB — COMPREHENSIVE METABOLIC PANEL WITH GFR
ALT: 44 IU/L (ref 0–44)
AST: 32 IU/L (ref 0–40)
Albumin: 3.9 g/dL (ref 3.9–4.9)
Alkaline Phosphatase: 64 IU/L (ref 44–121)
BUN/Creatinine Ratio: 18 (ref 10–24)
BUN: 14 mg/dL (ref 8–27)
Bilirubin Total: 0.8 mg/dL (ref 0.0–1.2)
CO2: 20 mmol/L (ref 20–29)
Calcium: 9 mg/dL (ref 8.6–10.2)
Chloride: 104 mmol/L (ref 96–106)
Creatinine, Ser: 0.77 mg/dL (ref 0.76–1.27)
Globulin, Total: 2.9 g/dL (ref 1.5–4.5)
Glucose: 105 mg/dL — ABNORMAL HIGH (ref 70–99)
Potassium: 4.1 mmol/L (ref 3.5–5.2)
Sodium: 138 mmol/L (ref 134–144)
Total Protein: 6.8 g/dL (ref 6.0–8.5)
eGFR: 99 mL/min/1.73 (ref >59)

## 2023-08-22 LAB — HEMOGLOBIN A1C
Est. average glucose Bld gHb Est-mCnc: 120 mg/dL
Hgb A1c MFr Bld: 5.8 % — ABNORMAL HIGH (ref 4.8–5.6)

## 2023-08-22 LAB — TSH: TSH: 0.135 u[IU]/mL — ABNORMAL LOW (ref 0.450–4.500)

## 2023-08-25 ENCOUNTER — Ambulatory Visit (HOSPITAL_BASED_OUTPATIENT_CLINIC_OR_DEPARTMENT_OTHER): Payer: Self-pay | Admitting: Family Medicine

## 2023-08-25 MED ORDER — LEVOTHYROXINE SODIUM 25 MCG PO TABS: 25.0000 ug | ORAL_TABLET | Freq: Every day | ORAL | 1 refills | Status: DC

## 2023-08-26 ENCOUNTER — Encounter (HOSPITAL_BASED_OUTPATIENT_CLINIC_OR_DEPARTMENT_OTHER): Payer: Self-pay | Admitting: *Deleted

## 2023-09-01 ENCOUNTER — Ambulatory Visit: Attending: Family Medicine | Admitting: Physical Therapy

## 2023-09-01 ENCOUNTER — Other Ambulatory Visit (HOSPITAL_BASED_OUTPATIENT_CLINIC_OR_DEPARTMENT_OTHER)

## 2023-09-01 ENCOUNTER — Encounter: Payer: Self-pay | Admitting: Physical Therapy

## 2023-09-01 ENCOUNTER — Other Ambulatory Visit: Payer: Self-pay

## 2023-09-01 DIAGNOSIS — M5459 Other low back pain: Secondary | ICD-10-CM | POA: Insufficient documentation

## 2023-09-01 DIAGNOSIS — M62838 Other muscle spasm: Secondary | ICD-10-CM | POA: Insufficient documentation

## 2023-09-01 DIAGNOSIS — G8929 Other chronic pain: Secondary | ICD-10-CM | POA: Diagnosis present

## 2023-09-01 DIAGNOSIS — M25512 Pain in left shoulder: Secondary | ICD-10-CM | POA: Insufficient documentation

## 2023-09-01 NOTE — Therapy (Signed)
 OUTPATIENT PHYSICAL THERAPY SHOULDER EVALUATION   Patient Name: Travis Zuniga MRN: [id number] DOB:[birth year], 63 y.o., male Today's Date: 09/01/2023  END OF SESSION:  PT End of Session - 09/01/23 1055     Visit Number 1    Number of Visits 12    Date for PT Re-Evaluation 10/13/23    PT Start Time 0845    PT Stop Time 0932    PT Time Calculation (min) 47 min    Activity Tolerance Patient tolerated treatment well    Behavior During Therapy [facility] for tasks assessed/performed          Past Medical History:  Diagnosis Date   Allergy    seasonal allergies   Arthritis    lower back    GERD (gastroesophageal reflux disease)    with certain foods/prior to weight loss/uses OTC meds PRN   Hx of adenomatous polyp of colon 01/04/2020   diminutive   Subclinical hypothyroidism 08/23/2021   Past Surgical History:  Procedure Laterality Date   COLONOSCOPY  01/05/2020   LUNG SURGERY N/A 1961   lung surgery-lung collapsed-   VASECTOMY N/A    WISDOM TOOTH EXTRACTION     Patient Active Problem List   Diagnosis Date Noted   Left shoulder pain 08/21/2023   Transaminitis 09/13/2022   Hematospermia 06/13/2022   Chronic low back pain 12/17/2021   Prediabetes 08/23/2021   Hypothyroidism 08/23/2021   Hepatic steatosis 08/23/2021   Wellness examination 03/08/2021   Positive ANA (antinuclear antibody) 01/17/2021   Obesity, Class I, BMI 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  THERAPY DIAG:  Chronic left shoulder pain  Other muscle spasm  Rationale for Evaluation and Treatment: Rehabilitation  ONSET DATE: January, 2025.    SUBJECTIVE:                                                                                                                                                                                       SUBJECTIVE STATEMENT: The patient presents to the clinic with c/o left shoulder pain that has been worsening since January of this year.  He thinks it could be related to repetitive arm motions (he works for the [employer]).  Certain motions can produce intense pain.  His sleep is disturbed due to pain.  His pain is rated at 7-8/10.  He describes the pain as ache, sharp and  shooting.    PERTINENT HISTORY: See above.    PAIN:  Are you having pain? Yes: NPRS scale: 7-8/10. Pain location: Left shoulder. Pain description: As above.   Aggravating factors: As above.   Relieving factors: Avoid certain motions.    PRECAUTIONS: None  FALLS:  Has patient fallen in last 6 months? No  LIVING ENVIRONMENT: Lives in: House/apartment Has following equipment at home: None  OCCUPATION: Works for the [employer].    PLOF: Independent  PATIENT GOALS:Use left shoulder without pain and sleep undisturbed by pain.    NEXT MD VISIT:   OBJECTIVE:  PATIENT SURVEYS:  Quick DASH:  59.09  POSTURE: Rounded shoulders.  UPPER EXTREMITY ROM:   WNL  UPPER EXTREMITY MMT: WNL.  SHOULDER SPECIAL TESTS: C/o left shoulder pain with Impingement testing.   PALPATION:  Tender to palpation over left UT/Supraspinatus.                                                                                                                               TREATMENT DATE: IFC at 80-150 Hz x 17 minutes to patient's left UT region f/b STW/M x 8 minutes including ischemic release technique.     PATIENT EDUCATION: Education details:  Person educated:  International aid/development worker:  Education comprehension:   HOME EXERCISE PROGRAM:   ASSESSMENT:  CLINICAL IMPRESSION: The patient presents to [facility]PP[facility] with c/o chronic left shoulder pain.  His left shoulder range of motion is very good and his strength is essentially normal.  He  wakes with pain.  He had pain with impingement testing. His Quick DASH score is 59.09. His is tender to palpation over the left UT/Supraspinatus musculature.  Patient will benefit from skilled PT intervention to address pain and deficits.  OBJECTIVE IMPAIRMENTS: decreased activity tolerance, increased muscle spasms, and pain.   ACTIVITY LIMITATIONS: sleeping and reach over head  PARTICIPATION LIMITATIONS: occupation  PERSONAL FACTORS: Time since onset of injury/illness/exacerbation are also affecting patient's functional outcome.   REHAB POTENTIAL: Good  CLINICAL DECISION MAKING: Evolving/moderate complexity  EVALUATION COMPLEXITY: Low   GOALS:  SHORT TERM GOALS: Target date: 09/15/23.  Ind with an initial HEP. Goal status: INITIAL   LONG TERM GOALS: Target date: 10/13/23  Ind with an advanced HEP.  Goal status: INITIAL  2.  Sleep undisturbed 6 hours. Goal status: INITIAL  3.  Perform ADL's with left shoulder pain not > 3/10.  Goal status: INITIAL  4.  Improve Quick DASH score by at least 20%. Goal status: INITIAL   PLAN:  PT FREQUENCY: 2x/week  PT DURATION: 6 weeks  PLANNED INTERVENTIONS: 97110-Therapeutic exercises, 97530- Therapeutic activity, [id number]- Neuromuscular re-education, 97535- Self Care, 02859- Manual therapy, 97016- Vasopneumatic device, [id number]- Ultrasound, 79439 (1-2 muscles), 20561 (3+ muscles)- Dry Needling, Patient/Family education, Cryotherapy, and Moist heat  PLAN FOR NEXT SESSION: UBE. RW4, sdly ER, combo e'stim/US , STW/M.     Miner Koral, [place], PT 09/01/2023, 12:27 PM

## 2023-09-05 ENCOUNTER — Ambulatory Visit: Admitting: Physical Therapy

## 2023-09-05 DIAGNOSIS — M62838 Other muscle spasm: Secondary | ICD-10-CM

## 2023-09-05 DIAGNOSIS — M25512 Pain in left shoulder: Secondary | ICD-10-CM | POA: Diagnosis not present

## 2023-09-05 DIAGNOSIS — M5459 Other low back pain: Secondary | ICD-10-CM

## 2023-09-05 DIAGNOSIS — G8929 Other chronic pain: Secondary | ICD-10-CM

## 2023-09-05 NOTE — Therapy (Signed)
 OUTPATIENT PHYSICAL THERAPY SHOULDER TREATMENT   Patient Name: Travis Zuniga MRN: [id number] DOB:[birth year], 63 y.o., male Today's Date: 09/05/2023  END OF SESSION:  PT End of Session - 09/05/23 0909     Visit Number 2    Number of Visits 12    Date for PT Re-Evaluation 10/13/23    PT Start Time 0800    PT Stop Time 0856    PT Time Calculation (min) 56 min    Activity Tolerance Patient tolerated treatment well    Behavior During Therapy [facility] for tasks assessed/performed           Past Medical History:  Diagnosis Date   Allergy    seasonal allergies   Arthritis    lower back    GERD (gastroesophageal reflux disease)    with certain foods/prior to weight loss/uses OTC meds PRN   Hx of adenomatous polyp of colon 01/04/2020   diminutive   Subclinical hypothyroidism 08/23/2021   Past Surgical History:  Procedure Laterality Date   COLONOSCOPY  01/05/2020   LUNG SURGERY N/A 1961   lung surgery-lung collapsed-   VASECTOMY N/A    WISDOM TOOTH EXTRACTION     Patient Active Problem List   Diagnosis Date Noted   Left shoulder pain 08/21/2023   Transaminitis 09/13/2022   Hematospermia 06/13/2022   Chronic low back pain 12/17/2021   Prediabetes 08/23/2021   Hypothyroidism 08/23/2021   Hepatic steatosis 08/23/2021   Wellness examination 03/08/2021   Positive ANA (antinuclear antibody) 01/17/2021   Obesity, Class I, BMI 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  THERAPY DIAG:  Chronic left shoulder pain  Other muscle spasm  Other low back pain  Rationale for Evaluation and Treatment: Rehabilitation  ONSET DATE: January, 2025.    SUBJECTIVE:                                                                                                                                                                                       SUBJECTIVE STATEMENT: Last treatment was helpful.  PERTINENT HISTORY: See above.    PAIN:  Are you having pain? Yes: NPRS scale: 7-8/10. Pain location: Left shoulder. Pain description: As above.   Aggravating factors: As above.   Relieving factors: Avoid certain motions.    PRECAUTIONS: None  FALLS:  Has patient fallen in last 6 months? No  LIVING  ENVIRONMENT: Lives in: House/apartment Has following equipment at home: None  OCCUPATION: Works for the [employer].    PLOF: Independent  PATIENT GOALS:Use left shoulder without pain and sleep undisturbed by pain.    NEXT MD VISIT:   OBJECTIVE:  PATIENT SURVEYS:  Quick DASH:  59.09  POSTURE: Rounded shoulders.  UPPER EXTREMITY ROM:   WNL  UPPER EXTREMITY MMT: WNL.  SHOULDER SPECIAL TESTS: C/o left shoulder pain with Impingement testing.   PALPATION:  Tender to palpation over left UT/Supraspinatus.                                                                                                                               TREATMENT DATE:  09/05/23:  Combo e'stim/US  at 1.50 W/CM2 x 12 minutes f/b STW/M x 14 minutes f/b IFC at 80-150 Hz on 40% scan x 20 minutes.  Normal modality response following removal of modality    PATIENT EDUCATION: Education details: Instruct in TENS set-up and usage. Person educated: Patient. Education method: Demo. Education comprehension:   HOME EXERCISE PROGRAM:   ASSESSMENT:  CLINICAL IMPRESSION: The patient reported good pain relief from his first tx for a couple of days. He did well with treatment today with good response to STW/M.   OBJECTIVE IMPAIRMENTS: decreased activity tolerance, increased muscle spasms, and pain.   ACTIVITY LIMITATIONS: sleeping and reach over head  PARTICIPATION LIMITATIONS: occupation  PERSONAL FACTORS: Time since  onset of injury/illness/exacerbation are also affecting patient's functional outcome.   REHAB POTENTIAL: Good  CLINICAL DECISION MAKING: Evolving/moderate complexity  EVALUATION COMPLEXITY: Low   GOALS:  SHORT TERM GOALS: Target date: 09/15/23.  Ind with an initial HEP. Goal status: INITIAL   LONG TERM GOALS: Target date: 10/13/23  Ind with an advanced HEP.  Goal status: INITIAL  2.  Sleep undisturbed 6 hours. Goal status: INITIAL  3.  Perform ADL's with left shoulder pain not > 3/10.  Goal status: INITIAL  4.  Improve Quick DASH score by at least 20%. Goal status: INITIAL   PLAN:  PT FREQUENCY: 2x/week  PT DURATION: 6 weeks  PLANNED INTERVENTIONS: 97110-Therapeutic exercises, 97530- Therapeutic activity, [id number]- Neuromuscular re-education, 97535- Self Care, 02859- Manual therapy, 97016- Vasopneumatic device, [id number]- Ultrasound, 79439 (1-2 muscles), 20561 (3+ muscles)- Dry Needling, Patient/Family education, Cryotherapy, and Moist heat  PLAN FOR NEXT SESSION: UBE. RW4, sdly ER, combo e'stim/US , STW/M.     Jarelle Ates, [place], PT 09/05/2023, 9:19 AM

## 2023-09-10 ENCOUNTER — Ambulatory Visit (INDEPENDENT_AMBULATORY_CARE_PROVIDER_SITE_OTHER)

## 2023-09-10 ENCOUNTER — Ambulatory Visit (INDEPENDENT_AMBULATORY_CARE_PROVIDER_SITE_OTHER): Admitting: Family Medicine

## 2023-09-10 ENCOUNTER — Encounter (HOSPITAL_BASED_OUTPATIENT_CLINIC_OR_DEPARTMENT_OTHER): Payer: Self-pay | Admitting: Family Medicine

## 2023-09-10 ENCOUNTER — Ambulatory Visit: Attending: Family Medicine | Admitting: Physical Therapy

## 2023-09-10 VITALS — BP 129/85 | HR 57 | Ht 71.0 in | Wt 243.8 lb

## 2023-09-10 DIAGNOSIS — G8929 Other chronic pain: Secondary | ICD-10-CM | POA: Diagnosis present

## 2023-09-10 DIAGNOSIS — M62838 Other muscle spasm: Secondary | ICD-10-CM | POA: Diagnosis present

## 2023-09-10 DIAGNOSIS — E039 Hypothyroidism, unspecified: Secondary | ICD-10-CM | POA: Diagnosis not present

## 2023-09-10 DIAGNOSIS — Z0000 Encounter for general adult medical examination without abnormal findings: Secondary | ICD-10-CM | POA: Diagnosis not present

## 2023-09-10 DIAGNOSIS — M25561 Pain in right knee: Secondary | ICD-10-CM | POA: Insufficient documentation

## 2023-09-10 DIAGNOSIS — M25512 Pain in left shoulder: Secondary | ICD-10-CM | POA: Diagnosis present

## 2023-09-10 DIAGNOSIS — M5459 Other low back pain: Secondary | ICD-10-CM | POA: Insufficient documentation

## 2023-09-10 NOTE — Assessment & Plan Note (Signed)
 Routine HCM labs reviewed. HCM reviewed/discussed. Anticipatory guidance regarding healthy weight, lifestyle and choices given. Recommend healthy diet.  Recommend approximately 150 minutes/week of moderate intensity exercise Recommend regular dental and vision exams Always use seatbelt/lap and shoulder restraints Recommend using smoke alarms and checking batteries at least twice a year Recommend using sunscreen when outside Discussed colon cancer screening recommendations, options.  Patient is UTD Discussed recommendations for shingles vaccine. Discussed immunization recommendations

## 2023-09-10 NOTE — Patient Instructions (Signed)
  Medication Instructions:  Your physician recommends that you continue on your current medications as directed. Please refer to the Current Medication list given to you today. --If you need a refill on any your medications before your next appointment, please call your pharmacy first. If no refills are authorized on file call the office.-- Lab Work: Your physician has recommended that you have lab work today: 1 week before next visit  If you have labs (blood work) drawn today and your tests are completely normal, you will receive your results via MyChart message OR a phone call from our staff.  Please ensure you check your voicemail in the event that you authorized detailed messages to be left on a delegated number. If you have any lab test that is abnormal or we need to change your treatment, we will call you to review the results.  Referrals/Procedures/Imaging: Xray today   Follow-Up: Your next appointment:   Your physician recommends that you schedule a follow-up appointment in: 2-3 months follow up  with Dr. de [place]  You will receive a text message or e-mail with a link to a survey about your care and experience with us  today! We would greatly appreciate your feedback!   Thanks for letting us  be apart of your health journey!!  Primary Care and Sports Medicine   Dr. Quintin sheerer Peru   We encourage you to activate your patient portal called MyChart.  Sign up information is provided on this After Visit Summary.  MyChart is used to connect with patients for Virtual Visits (Telemedicine).  Patients are able to view lab/test results, encounter notes, upcoming appointments, etc.  Non-urgent messages can be sent to your provider as well. To learn more about what you can do with MyChart, please visit --  [email or web address].

## 2023-09-10 NOTE — Assessment & Plan Note (Signed)
 Suspect possible underlying osteoarthritis, may also have component of patellofemoral pain syndrome.  Pain through right posterior thigh may be more so related to sciatica as opposed to a specific relation to observed right knee pain. We discussed considerations, can proceed with initial x-ray for further evaluation.  We discussed considerations if x-ray does ultimately show underlying osteoarthritis.  This includes home exercise program, physical therapy, procedural considerations including injection and surgery.  Further recommendations pending results of imaging

## 2023-09-10 NOTE — Progress Notes (Signed)
 Subjective:    CC: Annual Physical Exam  HPI:  Travis Zuniga is a 63 y.o. presenting for annual physical  Some knee pain, right worse than left, more noticeable with increase in walking regimen. No prior significant injuries to knees in the past Also with some posterior right thigh pain, worse with long drives.  I reviewed the past medical history, family history, social history, surgical history, and allergies today and no changes were needed.  Please see the problem list section below in epic for further details.  Past Medical History: Past Medical History:  Diagnosis Date   Allergy    seasonal allergies   Arthritis    lower back    GERD (gastroesophageal reflux disease)    with certain foods/prior to weight loss/uses OTC meds PRN   Hx of adenomatous polyp of colon 01/04/2020   diminutive   Subclinical hypothyroidism 08/23/2021   Past Surgical History: Past Surgical History:  Procedure Laterality Date   COLONOSCOPY  01/05/2020   LUNG SURGERY N/A 1961   lung surgery-lung collapsed-   VASECTOMY N/A    WISDOM TOOTH EXTRACTION     Social History: Social History   Socioeconomic History   Marital status: Married    Spouse name: Not on file   Number of children: Not on file   Years of education: Not on file   Highest education level: Bachelor's degree (e.g., BA, AB, BS)  Occupational History    Employer: [employer]  Tobacco Use   Smoking status: Never    Passive exposure: Never   Smokeless tobacco: Never  Vaping Use   Vaping status: Never Used  Substance and Sexual Activity   Alcohol use: Yes    Comment: a 12 pack per month   Drug use: Never   Sexual activity: Yes    Birth control/protection: None  Other Topics Concern   Not on file  Social History Narrative   Not on file   Social Drivers of Health   Financial Resource Strain: Low Risk  (08/21/2023)   Overall Financial Resource Strain (CARDIA)    Difficulty of Paying Living Expenses: Not hard at all  Food  Insecurity: No Food Insecurity (08/21/2023)   Hunger Vital Sign    Worried About Running Out of Food in the Last Year: Never true    Ran Out of Food in the Last Year: Never true  Transportation Needs: No Transportation Needs (08/21/2023)   PRAPARE - Administrator, Civil Service (Medical): No    Lack of Transportation (Non-Medical): No  Physical Activity: Unknown (08/21/2023)   Exercise Vital Sign    Days of Exercise per Week: 3 days    Minutes of Exercise per Session: Not on file  Stress: No Stress Concern Present (08/21/2023)   [employer] of Occupational Health - Occupational Stress Questionnaire    Feeling of Stress: Only a little  Social Connections: Moderately Isolated (08/21/2023)   Social Connection and Isolation Panel    Frequency of Communication with Friends and Family: More than three times a week    Frequency of Social Gatherings with Friends and Family: Patient declined    Attends Religious Services: Never    Database administrator or Organizations: No    Attends Engineer, structural: Not on file    Marital Status: Married   Family History: Family History  Problem Relation Age of Onset   Heart disease Mother        rheumatic fever as child   Stroke Mother  Smoker   Alcohol abuse Mother    Miscarriages / [place] Mother    Alcohol abuse Father    Liver disease Father        Possibly   Arthritis Sister    Diabetes Brother    Liver disease Brother        NASH   Heart disease Paternal Grandfather        MI   Diabetes Brother    Colon polyps Brother    Stroke Brother    Healthy Sister    Colon cancer Neg Hx    Esophageal cancer Neg Hx    Rectal cancer Neg Hx    Stomach cancer Neg Hx    Allergies: No Known Allergies Medications: See med rec.  Review of Systems: No headache, visual changes, nausea, vomiting, diarrhea, constipation, dizziness, abdominal pain, skin rash, fevers, chills, night sweats, swollen lymph nodes, weight  loss, chest pain, body aches, joint swelling, muscle aches, shortness of breath, mood changes, visual or auditory hallucinations.  Objective:    BP 129/85 (BP Location: Right Arm, Patient Position: Sitting, Cuff Size: Normal)   Pulse (!) 57   Ht 5' 11 (1.803 m)   Wt 243 lb 12.8 oz (110.6 kg)   SpO2 97%   BMI 34.00 kg/m   General: Well Developed, well nourished, and in no acute distress. Neuro: Alert and oriented x3, extra-ocular muscles intact, sensation grossly intact. Cranial nerves II through XII are intact, motor, sensory, and coordinative functions are all intact. HEENT: Normocephalic, atraumatic, pupils equal round reactive to light, neck supple, no masses, no lymphadenopathy, thyroid nonpalpable. Oropharynx, nasopharynx, external ear canals are unremarkable. Skin: Warm and dry, no rashes noted. Cardiac: Regular rate and rhythm, no murmurs rubs or gallops.  Respiratory: Clear to auscultation bilaterally. Not using accessory muscles, speaking in full sentences. Abdominal: Soft, nontender, nondistended, positive bowel sounds, no masses, no organomegaly. Musculoskeletal: Shoulder, elbow, wrist, hip, knee, ankle stable, and with full range of motion.  Impression and Recommendations:    Wellness examination Assessment & Plan: Routine HCM labs reviewed. HCM reviewed/discussed. Anticipatory guidance regarding healthy weight, lifestyle and choices given. Recommend healthy diet.  Recommend approximately 150 minutes/week of moderate intensity exercise Recommend regular dental and vision exams Always use seatbelt/lap and shoulder restraints Recommend using smoke alarms and checking batteries at least twice a year Recommend using sunscreen when outside Discussed colon cancer screening recommendations, options.  Patient is UTD Discussed recommendations for shingles vaccine. Discussed immunization recommendations   Right knee pain, unspecified chronicity Assessment & Plan: Suspect  possible underlying osteoarthritis, may also have component of patellofemoral pain syndrome.  Pain through right posterior thigh may be more so related to sciatica as opposed to a specific relation to observed right knee pain. We discussed considerations, can proceed with initial x-ray for further evaluation.  We discussed considerations if x-ray does ultimately show underlying osteoarthritis.  This includes home exercise program, physical therapy, procedural considerations including injection and surgery.  Further recommendations pending results of imaging  Orders: -     DG Knee Complete 4 Views Right; Future  Hypothyroidism, unspecified type Assessment & Plan: Most recent labs did show TSH slightly below reference range.  Recommend decreased dose of levothyroxine .  Patient did start lower dose last week.  Will plan to recheck TSH in about 5 to 6 weeks, this will be before next appointment.  Further recommendations regarding adjusting dose of levothyroxine  will be made based upon next labs  Orders: -     TSH;  Future  Return in about 2 months (around 11/10/2023) for shoulder pain, knee pain, lab follow-up.   ___________________________________________ Levern Pitter de [place], MD, ABFM, CAQSM Primary Care and Sports Medicine [facility]

## 2023-09-10 NOTE — Therapy (Signed)
 OUTPATIENT PHYSICAL THERAPY SHOULDER TREATMENT   Patient Name: Travis Zuniga MRN: [id number] DOB:1960/01/09, 63 y.o., male Today's Date: 09/10/2023  END OF SESSION:  PT End of Session - 09/10/23 1434     Visit Number 3    Number of Visits 12    Date for PT Re-Evaluation 10/13/23    PT Start Time 0230    PT Stop Time 0326    PT Time Calculation (min) 56 min    Activity Tolerance Patient tolerated treatment well    Behavior During Therapy [facility] for tasks assessed/performed            Past Medical History:  Diagnosis Date   Allergy    seasonal allergies   Arthritis    lower back    GERD (gastroesophageal reflux disease)    with certain foods/prior to weight loss/uses OTC meds PRN   Hx of adenomatous polyp of colon 01/04/2020   diminutive   Subclinical hypothyroidism 08/23/2021   Past Surgical History:  Procedure Laterality Date   COLONOSCOPY  01/05/2020   LUNG SURGERY N/A 1961   lung surgery-lung collapsed-   VASECTOMY N/A    WISDOM TOOTH EXTRACTION     Patient Active Problem List   Diagnosis Date Noted   Knee pain, right 09/10/2023   Left shoulder pain 08/21/2023   Transaminitis 09/13/2022   Hematospermia 06/13/2022   Chronic low back pain 12/17/2021   Prediabetes 08/23/2021   Hypothyroidism 08/23/2021   Hepatic steatosis 08/23/2021   Wellness examination 03/08/2021   Positive ANA (antinuclear antibody) 01/17/2021   Obesity, Class I, BMI 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  THERAPY DIAG:  Chronic left shoulder pain  Other muscle spasm  Other low back pain  Rationale for Evaluation and Treatment: Rehabilitation  ONSET DATE: January, 2025.    SUBJECTIVE:                                                                                                                                                                                       SUBJECTIVE STATEMENT: Doing better.  PERTINENT HISTORY: See above.    PAIN:  Are you having pain? Yes: NPRS scale: 5/10. Pain location: Left shoulder. Pain description: As above.   Aggravating factors: As above.   Relieving factors: Avoid certain motions.    PRECAUTIONS: None  FALLS:  Has patient fallen in last  6 months? No  LIVING ENVIRONMENT: Lives in: House/apartment Has following equipment at home: None  OCCUPATION: Works for the [employer].    PLOF: Independent  PATIENT GOALS:Use left shoulder without pain and sleep undisturbed by pain.    NEXT MD VISIT:   OBJECTIVE:  PATIENT SURVEYS:  Quick DASH:  59.09  POSTURE: Rounded shoulders.  UPPER EXTREMITY ROM:   WNL  UPPER EXTREMITY MMT: WNL.  SHOULDER SPECIAL TESTS: C/o left shoulder pain with Impingement testing.   PALPATION:  Tender to palpation over left UT/Supraspinatus.                                                                                                                               TREATMENT DATE:   09/10/23:  UBE x 6 minutes (3 minutes forward and 3 minutes backward) f/b RW4 with red theraband to fatigue f/b combo e'stim/US  at 1.50 W/CM2 x 7 minutes to left UT region f/b STW/M x 8 minutes with ischemic release technique utilized f/b IFC at 80-150 Hz on 40% scan x 20 minutes with moist heat. Normal modality response following removal of modality   09/05/23:  Combo e'stim/US  at 1.50 W/CM2 x 12 minutes f/b STW/M x 14 minutes f/b IFC at 80-150 Hz on 40% scan x 20 minutes.  Normal modality response following removal of modality    PATIENT EDUCATION: Education details: RW29.   Person educated: Patient. Education method: Demo. Education comprehension: Excellent.  HOME EXERCISE PROGRAM: RW4  ASSESSMENT:  CLINICAL  IMPRESSION: Patient pleased with his progress and reporting less pain consistently.    OBJECTIVE IMPAIRMENTS: decreased activity tolerance, increased muscle spasms, and pain.   ACTIVITY LIMITATIONS: sleeping and reach over head  PARTICIPATION LIMITATIONS: occupation  PERSONAL FACTORS: Time since onset of injury/illness/exacerbation are also affecting patient's functional outcome.   REHAB POTENTIAL: Good  CLINICAL DECISION MAKING: Evolving/moderate complexity  EVALUATION COMPLEXITY: Low   GOALS:  SHORT TERM GOALS: Target date: 09/15/23.  Ind with an initial HEP. Goal status: INITIAL   LONG TERM GOALS: Target date: 10/13/23  Ind with an advanced HEP.  Goal status: INITIAL  2.  Sleep undisturbed 6 hours. Goal status: INITIAL  3.  Perform ADL's with left shoulder pain not > 3/10.  Goal status: INITIAL  4.  Improve Quick DASH score by at least 20%. Goal status: INITIAL   PLAN:  PT FREQUENCY: 2x/week  PT DURATION: 6 weeks  PLANNED INTERVENTIONS: 97110-Therapeutic exercises, 97530- Therapeutic activity, [id number]- Neuromuscular re-education, 97535- Self Care, [id number]- Manual therapy, 97016- Vasopneumatic device, [id number]- Ultrasound, 79439 (1-2 muscles), 20561 (3+ muscles)- Dry Needling, Patient/Family education, Cryotherapy, and Moist heat  PLAN FOR NEXT SESSION: UBE. RW4, sdly ER, combo e'stim/US , STW/M.     Kydan Shanholtzer, [place], PT 09/10/2023, 3:37 PM

## 2023-09-10 NOTE — Assessment & Plan Note (Signed)
 Most recent labs did show TSH slightly below reference range.  Recommend decreased dose of levothyroxine .  Patient did start lower dose last week.  Will plan to recheck TSH in about 5 to 6 weeks, this will be before next appointment.  Further recommendations regarding adjusting dose of levothyroxine  will be made based upon next labs

## 2023-09-18 ENCOUNTER — Ambulatory Visit: Admitting: Physical Therapy

## 2023-09-18 DIAGNOSIS — G8929 Other chronic pain: Secondary | ICD-10-CM

## 2023-09-18 DIAGNOSIS — M62838 Other muscle spasm: Secondary | ICD-10-CM

## 2023-09-18 DIAGNOSIS — M5459 Other low back pain: Secondary | ICD-10-CM

## 2023-09-18 DIAGNOSIS — M25512 Pain in left shoulder: Secondary | ICD-10-CM | POA: Diagnosis not present

## 2023-09-18 NOTE — Therapy (Signed)
 OUTPATIENT PHYSICAL THERAPY SHOULDER TREATMENT   Patient Name: Travis Zuniga MRN: [id number] DOB:May 06, 1960, 63 y.o., male Today's Date: 09/18/2023  END OF SESSION:  PT End of Session - 09/18/23 0915     Visit Number 4    Number of Visits 12    Date for PT Re-Evaluation 10/13/23    PT Start Time 0800    Activity Tolerance Patient tolerated treatment well    Behavior During Therapy [facility] for tasks assessed/performed            Past Medical History:  Diagnosis Date   Allergy    seasonal allergies   Arthritis    lower back    GERD (gastroesophageal reflux disease)    with certain foods/prior to weight loss/uses OTC meds PRN   Hx of adenomatous polyp of colon 01/04/2020   diminutive   Subclinical hypothyroidism 08/23/2021   Past Surgical History:  Procedure Laterality Date   COLONOSCOPY  01/05/2020   LUNG SURGERY N/A 1961   lung surgery-lung collapsed-   VASECTOMY N/A    WISDOM TOOTH EXTRACTION     Patient Active Problem List   Diagnosis Date Noted   Knee pain, right 09/10/2023   Left shoulder pain 08/21/2023   Transaminitis 09/13/2022   Hematospermia 06/13/2022   Chronic low back pain 12/17/2021   Prediabetes 08/23/2021   Hypothyroidism 08/23/2021   Hepatic steatosis 08/23/2021   Wellness examination 03/08/2021   Positive ANA (antinuclear antibody) 01/17/2021   Obesity, Class I, BMI 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  THERAPY DIAG:  Chronic left shoulder pain  Other muscle spasm  Other low back pain  Rationale for Evaluation and Treatment: Rehabilitation  ONSET DATE: January, 2025.    SUBJECTIVE:                                                                                                                                                                                       SUBJECTIVE STATEMENT: Did real well since last treatment until yesterday wit pain returning.    PERTINENT HISTORY: See above.    PAIN:  Are you having pain? Yes: NPRS scale: 5/10. Pain location: Left shoulder. Pain description: As above.   Aggravating factors: As above.   Relieving factors: Avoid certain motions.    PRECAUTIONS: None  FALLS:  Has patient fallen in last 6 months? No  LIVING  ENVIRONMENT: Lives in: House/apartment Has following equipment at home: None  OCCUPATION: Works for the [employer].    PLOF: Independent  PATIENT GOALS:Use left shoulder without pain and sleep undisturbed by pain.    NEXT MD VISIT:   OBJECTIVE:  PATIENT SURVEYS:  Quick DASH:  59.09  POSTURE: Rounded shoulders.  UPPER EXTREMITY ROM:   WNL  UPPER EXTREMITY MMT: WNL.  SHOULDER SPECIAL TESTS: C/o left shoulder pain with Impingement testing.   PALPATION:  Tender to palpation over left UT/Supraspinatus.                                                                                                                               TREATMENT DATE:   09/18/23:  UBE x 10 minutes f/b Combo e'stim/US  at 1.50 W/CM2 x 7 minutes to patient's left UT f/b STW/M x 10 minutes with ischemic release technique utilized f/b HMP and IFC at 80-150 Hz on 40% scan x 20 minutes.  Normal modality response following removal of modality  09/10/23:  UBE x 6 minutes (3 minutes forward and 3 minutes backward) f/b RW4 with red theraband to fatigue f/b combo e'stim/US  at 1.50 W/CM2 x 7 minutes to left UT region f/b STW/M x 8 minutes with ischemic release technique utilized f/b IFC at 80-150 Hz on 40% scan x 20 minutes with moist heat. Normal modality response following removal of modality   09/05/23:  Combo e'stim/US  at 1.50 W/CM2 x 12 minutes f/b STW/M x 14 minutes f/b IFC at 80-150 Hz on 40% scan x 20 minutes.   Normal modality response following removal of modality    PATIENT EDUCATION: Education details: SDLY   Person educated: Patient. Education method: Demo. Education comprehension: Excellent.  HOME EXERCISE PROGRAM: HOME EXERCISE PROGRAM [[id number]]  Sidelying ER -  Repeat 15 Repetitions, Complete 2 Sets, Perform 2 Times a Day  ASSESSMENT:  CLINICAL IMPRESSION: Patient had a significant reduction in pain since last treatment with pain returning yesterday.  He exhibited significant tone over the left UT and Levator Scapulae today with good response to treatment.  Added SDLY ER to his HEP.    OBJECTIVE IMPAIRMENTS: decreased activity tolerance, increased muscle spasms, and pain.   ACTIVITY LIMITATIONS: sleeping and reach over head  PARTICIPATION LIMITATIONS: occupation  PERSONAL FACTORS: Time since onset of injury/illness/exacerbation are also affecting patient's functional outcome.   REHAB POTENTIAL: Good  CLINICAL DECISION MAKING: Evolving/moderate complexity  EVALUATION COMPLEXITY: Low   GOALS:  SHORT TERM GOALS: Target date: 09/15/23.  Ind with an initial HEP. Goal status: INITIAL   LONG TERM GOALS: Target date: 10/13/23  Ind with an advanced HEP.  Goal status: INITIAL  2.  Sleep undisturbed 6 hours. Goal status: INITIAL  3.  Perform ADL's with left shoulder pain not > 3/10.  Goal status: INITIAL  4.  Improve Quick DASH score by at least 20%. Goal status: INITIAL   PLAN:  PT FREQUENCY: 2x/week  PT DURATION: 6 weeks  PLANNED INTERVENTIONS: 97110-Therapeutic exercises, 97530- Therapeutic activity, [id number]- Neuromuscular re-education, 97535- Self Care, 02859- Manual therapy, 97016- Vasopneumatic device, [id number]- Ultrasound, 79439 (1-2 muscles), 20561 (3+ muscles)- Dry Needling, Patient/Family education, Cryotherapy, and Moist heat  PLAN FOR NEXT SESSION: UBE. RW4, sdly ER, combo e'stim/US , STW/M.     Decker Cogdell, [place], PT 09/18/2023, 9:20 AM

## 2023-09-25 ENCOUNTER — Ambulatory Visit (HOSPITAL_BASED_OUTPATIENT_CLINIC_OR_DEPARTMENT_OTHER): Payer: Self-pay | Admitting: Family Medicine

## 2023-09-26 ENCOUNTER — Ambulatory Visit: Admitting: Physical Therapy

## 2023-09-26 DIAGNOSIS — M25512 Pain in left shoulder: Secondary | ICD-10-CM | POA: Diagnosis not present

## 2023-09-26 DIAGNOSIS — G8929 Other chronic pain: Secondary | ICD-10-CM

## 2023-09-26 DIAGNOSIS — M62838 Other muscle spasm: Secondary | ICD-10-CM

## 2023-09-26 DIAGNOSIS — M5459 Other low back pain: Secondary | ICD-10-CM

## 2023-09-26 NOTE — Therapy (Signed)
 OUTPATIENT PHYSICAL THERAPY SHOULDER TREATMENT   Patient Name: Travis Zuniga MRN: [id number] DOB:03-09-1960, 63 y.o., male Today's Date: 09/26/2023  END OF SESSION:  PT End of Session - 09/26/23 0851     Visit Number 5    Number of Visits 12    Date for Recertification  10/13/23    PT Start Time 0800    PT Stop Time 0858    PT Time Calculation (min) 58 min    Activity Tolerance Patient tolerated treatment well    Behavior During Therapy [facility] for tasks assessed/performed            Past Medical History:  Diagnosis Date   Allergy    seasonal allergies   Arthritis    lower back    GERD (gastroesophageal reflux disease)    with certain foods/prior to weight loss/uses OTC meds PRN   Hx of adenomatous polyp of colon 01/04/2020   diminutive   Subclinical hypothyroidism 08/23/2021   Past Surgical History:  Procedure Laterality Date   COLONOSCOPY  01/05/2020   LUNG SURGERY N/A 1961   lung surgery-lung collapsed-   VASECTOMY N/A    WISDOM TOOTH EXTRACTION     Patient Active Problem List   Diagnosis Date Noted   Knee pain, right 09/10/2023   Left shoulder pain 08/21/2023   Transaminitis 09/13/2022   Hematospermia 06/13/2022   Chronic low back pain 12/17/2021   Prediabetes 08/23/2021   Hypothyroidism 08/23/2021   Hepatic steatosis 08/23/2021   Wellness examination 03/08/2021   Positive ANA (antinuclear antibody) 01/17/2021   Obesity, Class I, BMI 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  THERAPY DIAG:  Chronic left shoulder pain  Other muscle spasm  Other low back pain  Rationale for Evaluation and Treatment: Rehabilitation  ONSET DATE: January, 2025.    SUBJECTIVE:                                                                                                                                                                                       SUBJECTIVE STATEMENT: Overall doing much better.  Been doing the exercises and got a theracane.    PERTINENT HISTORY: See above.    PAIN:  Are you having pain? Yes: NPRS scale: /10. Pain location: Left shoulder. Pain description: As above.   Aggravating factors: As above.   Relieving factors: Avoid certain motions.  PRECAUTIONS: None  FALLS:  Has patient fallen in last 6 months? No  LIVING ENVIRONMENT: Lives in: House/apartment Has following equipment at home: None  OCCUPATION: Works for the [employer].    PLOF: Independent  PATIENT GOALS:Use left shoulder without pain and sleep undisturbed by pain.    NEXT MD VISIT:   OBJECTIVE:  PATIENT SURVEYS:  Quick DASH:  59.09  POSTURE: Rounded shoulders.  UPPER EXTREMITY ROM:   WNL  UPPER EXTREMITY MMT: WNL.  SHOULDER SPECIAL TESTS: C/o left shoulder pain with Impingement testing.   PALPATION:  Tender to palpation over left UT/Supraspinatus.                                                                                                                               TREATMENT DATE:   09/26/23:  UBE x 90 RPM's f/b STW/M x 13 minutes f/b IFC at 80-150 Hz on 40% scan x 20 minutes with vasopneumatic on low.  Normal modality response following removal of modality.  09/18/23:  UBE x 10 minutes f/b Combo e'stim/US  at 1.50 W/CM2 x 7 minutes to patient's left UT f/b STW/M x 10 minutes with ischemic release technique utilized f/b HMP and IFC at 80-150 Hz on 40% scan x 20 minutes.  Normal modality response following removal of modality    PATIENT EDUCATION: Education details: SDLY   Person educated: Patient. Education method: Demo. Education comprehension: Excellent.  HOME EXERCISE PROGRAM: HOME EXERCISE PROGRAM [[facility]]  Sidelying ER -  Repeat 15  Repetitions, Complete 2 Sets, Perform 2 Times a Day  ASSESSMENT:  CLINICAL IMPRESSION: Patient is highly motivated and is compliant to his HEP.  His pain has been consistently lower.    OBJECTIVE IMPAIRMENTS: decreased activity tolerance, increased muscle spasms, and pain.   ACTIVITY LIMITATIONS: sleeping and reach over head  PARTICIPATION LIMITATIONS: occupation  PERSONAL FACTORS: Time since onset of injury/illness/exacerbation are also affecting patient's functional outcome.   REHAB POTENTIAL: Good  CLINICAL DECISION MAKING: Evolving/moderate complexity  EVALUATION COMPLEXITY: Low   GOALS:  SHORT TERM GOALS: Target date: 09/15/23.  Ind with an initial HEP. Goal status: INITIAL   LONG TERM GOALS: Target date: 10/13/23  Ind with an advanced HEP.  Goal status: INITIAL  2.  Sleep undisturbed 6 hours. Goal status: INITIAL  3.  Perform ADL's with left shoulder pain not > 3/10.  Goal status: INITIAL  4.  Improve Quick DASH score by at least 20%. Goal status: INITIAL   PLAN:  PT FREQUENCY: 2x/week  PT DURATION: 6 weeks  PLANNED INTERVENTIONS: 97110-Therapeutic exercises, 97530- Therapeutic activity, [id number]- Neuromuscular re-education, 97535- Self Care, [id number]- Manual therapy, 97016- Vasopneumatic device, [id number]- Ultrasound, 79439 (1-2 muscles), 20561 (3+ muscles)- Dry Needling, Patient/Family education, Cryotherapy, and Moist heat  PLAN FOR NEXT SESSION: UBE. RW4, sdly ER, combo e'stim/US , STW/M.     Jini Horiuchi, [place], PT 09/26/2023, 9:31 AM

## 2023-10-01 ENCOUNTER — Other Ambulatory Visit (HOSPITAL_BASED_OUTPATIENT_CLINIC_OR_DEPARTMENT_OTHER): Payer: Self-pay | Admitting: *Deleted

## 2023-10-01 MED ORDER — MELOXICAM 7.5 MG PO TABS: 7.5000 mg | ORAL_TABLET | Freq: Every day | ORAL | 0 refills | Status: AC

## 2023-10-03 ENCOUNTER — Ambulatory Visit: Admitting: *Deleted

## 2023-10-03 ENCOUNTER — Encounter: Payer: Self-pay | Admitting: *Deleted

## 2023-10-03 DIAGNOSIS — G8929 Other chronic pain: Secondary | ICD-10-CM

## 2023-10-03 DIAGNOSIS — M25512 Pain in left shoulder: Secondary | ICD-10-CM | POA: Diagnosis not present

## 2023-10-03 DIAGNOSIS — M62838 Other muscle spasm: Secondary | ICD-10-CM

## 2023-10-03 NOTE — Therapy (Signed)
 OUTPATIENT PHYSICAL THERAPY SHOULDER TREATMENT   Patient Name: Travis Zuniga MRN: [id number] DOB:1960/06/27, 63 y.o., male Today's Date: 10/03/2023  END OF SESSION:  PT End of Session - 10/03/23 0807     Visit Number 6    Number of Visits 12    Date for Recertification  10/13/23    PT Start Time 0800    PT Stop Time 0858    PT Time Calculation (min) 58 min            Past Medical History:  Diagnosis Date   Allergy    seasonal allergies   Arthritis    lower back    GERD (gastroesophageal reflux disease)    with certain foods/prior to weight loss/uses OTC meds PRN   Hx of adenomatous polyp of colon 01/04/2020   diminutive   Subclinical hypothyroidism 08/23/2021   Past Surgical History:  Procedure Laterality Date   COLONOSCOPY  01/05/2020   LUNG SURGERY N/A 1961   lung surgery-lung collapsed-   VASECTOMY N/A    WISDOM TOOTH EXTRACTION     Patient Active Problem List   Diagnosis Date Noted   Knee pain, right 09/10/2023   Left shoulder pain 08/21/2023   Transaminitis 09/13/2022   Hematospermia 06/13/2022   Chronic low back pain 12/17/2021   Prediabetes 08/23/2021   Hypothyroidism 08/23/2021   Hepatic steatosis 08/23/2021   Wellness examination 03/08/2021   Positive ANA (antinuclear antibody) 01/17/2021   Obesity, Class I, BMI 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  THERAPY DIAG:  Chronic left shoulder pain  Other muscle spasm  Rationale for Evaluation and Treatment: Rehabilitation  ONSET DATE: January, 2025.    SUBJECTIVE:                                                                                                                                                                                       SUBJECTIVE STATEMENT: Overall doing much better overall. LT Utrap  is very tight   PERTINENT HISTORY: See above.    PAIN:  Are you having pain? Yes: NPRS scale: 4/10. Pain location: Left shoulder. Pain description: As above.   Aggravating factors: As above.   Relieving factors: Avoid certain motions.    PRECAUTIONS: None  FALLS:  Has patient fallen in last 6 months? No  LIVING ENVIRONMENT: Lives in: House/apartment Has following equipment at home:  None  OCCUPATION: Works for [employer]    PLOF: Independent  PATIENT GOALS:Use left shoulder without pain and sleep undisturbed by pain.    NEXT MD VISIT:   OBJECTIVE:  PATIENT SURVEYS:  Quick DASH:  59.09  POSTURE: Rounded shoulders.  UPPER EXTREMITY ROM:   WNL  UPPER EXTREMITY MMT: WNL.  SHOULDER SPECIAL TESTS: C/o left shoulder pain with Impingement testing.   PALPATION:  Tender to palpation over left UT/Supraspinatus.                                                                                                                               TREATMENT DATE:   10/03/23:   UBE x 10  90 RPM's  US  combo 1.5 w/cm2  x 8 mins to LT Utrap/ Levator  STW/M x 10 minutes  to LT Utrap and levator TPR  IFC at 80-150 Hz on 40% scan x 15 minutes with vasopneumatic on low.  Normal modality response following removal of modality.  09/18/23:  UBE x 10 minutes f/b Combo e'stim/US  at 1.50 W/CM2 x 7 minutes to patient's left UT f/b STW/M x 10 minutes with ischemic release technique utilized f/b HMP and IFC at 80-150 Hz on 40% scan x 20 minutes.  Normal modality response following removal of modality    PATIENT EDUCATION: Education details: SDLY   Person educated: Patient. Education method: Demo. Education comprehension: Excellent.  HOME EXERCISE PROGRAM: HOME EXERCISE PROGRAM [[facility]]  Sidelying ER -  Repeat 15 Repetitions, Complete 2 Sets, Perform 2 Times a Day  ASSESSMENT:  CLINICAL  IMPRESSION: Patient arrived today doing better overall. But reports having good days and then pain returns. He was able to continue with therex f/b US  combo and STW as well as estim and Vaso to LT shldr and did well.      OBJECTIVE IMPAIRMENTS: decreased activity tolerance, increased muscle spasms, and pain.   ACTIVITY LIMITATIONS: sleeping and reach over head  PARTICIPATION LIMITATIONS: occupation  PERSONAL FACTORS: Time since onset of injury/illness/exacerbation are also affecting patient's functional outcome.   REHAB POTENTIAL: Good  CLINICAL DECISION MAKING: Evolving/moderate complexity  EVALUATION COMPLEXITY: Low   GOALS:  SHORT TERM GOALS: Target date: 09/15/23.  Ind with an initial HEP. Goal status: Met   LONG TERM GOALS: Target date: 10/13/23  Ind with an advanced HEP.  Goal status: On going  2.  Sleep undisturbed 6 hours. Goal status: On going  3.  Perform ADL's with left shoulder pain not > 3/10.  Goal status: On going  4.  Improve Quick DASH score by at least 20%. Goal status: On going   PLAN:  PT FREQUENCY: 2x/week  PT DURATION: 6 weeks  PLANNED INTERVENTIONS: 97110-Therapeutic exercises, 97530- Therapeutic activity, [id number]- Neuromuscular re-education, 97535- Self Care, 02859- Manual therapy, 97016- Vasopneumatic device, [id number]- Ultrasound, 79439 (1-2 muscles), 20561 (3+ muscles)- Dry Needling, Patient/Family education, Cryotherapy, and Moist heat  PLAN FOR NEXT SESSION: UBE. RW4, sdly ER, combo  e'stim/US , STW/M.     Moniqua Engebretsen,CHRIS, PTA 10/03/2023, 1:13 PM

## 2023-10-09 ENCOUNTER — Encounter: Payer: Self-pay | Admitting: *Deleted

## 2023-10-09 ENCOUNTER — Ambulatory Visit: Attending: Family Medicine | Admitting: *Deleted

## 2023-10-09 DIAGNOSIS — M5459 Other low back pain: Secondary | ICD-10-CM | POA: Insufficient documentation

## 2023-10-09 DIAGNOSIS — G8929 Other chronic pain: Secondary | ICD-10-CM | POA: Diagnosis present

## 2023-10-09 DIAGNOSIS — M25512 Pain in left shoulder: Secondary | ICD-10-CM | POA: Diagnosis present

## 2023-10-09 DIAGNOSIS — M62838 Other muscle spasm: Secondary | ICD-10-CM | POA: Diagnosis present

## 2023-10-09 NOTE — Therapy (Signed)
 OUTPATIENT PHYSICAL THERAPY SHOULDER TREATMENT   Patient Name: Travis Zuniga MRN: [id number] DOB:1960-01-22, 63 y.o., male Today's Date: 10/09/2023  END OF SESSION:  PT End of Session - 10/09/23 1659     Visit Number 7    Number of Visits 12    Date for Recertification  10/13/23    Authorization - Number of Visits 12     PT Start Time 0800    PT Stop Time 0850    PT Time Calculation (min) 50 min             Past Medical History:  Diagnosis Date   Allergy    seasonal allergies   Arthritis    lower back    GERD (gastroesophageal reflux disease)    with certain foods/prior to weight loss/uses OTC meds PRN   Hx of adenomatous polyp of colon 01/04/2020   diminutive   Subclinical hypothyroidism 08/23/2021   Past Surgical History:  Procedure Laterality Date   COLONOSCOPY  01/05/2020   LUNG SURGERY N/A 1961   lung surgery-lung collapsed-   VASECTOMY N/A    WISDOM TOOTH EXTRACTION     Patient Active Problem List   Diagnosis Date Noted   Knee pain, right 09/10/2023   Left shoulder pain 08/21/2023   Transaminitis 09/13/2022   Hematospermia 06/13/2022   Chronic low back pain 12/17/2021   Prediabetes 08/23/2021   Hypothyroidism 08/23/2021   Hepatic steatosis 08/23/2021   Wellness examination 03/08/2021   Positive ANA (antinuclear antibody) 01/17/2021   Obesity, Class I, BMI 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  THERAPY DIAG:  Chronic left shoulder pain  Other muscle spasm  Other low back pain  Rationale for Evaluation and Treatment: Rehabilitation  ONSET DATE: January, 2025.    SUBJECTIVE:                                                                                                                                                                                       SUBJECTIVE STATEMENT: Overall doing much better . 2/10 today  PERTINENT HISTORY: See above.    PAIN:  Are you having pain? Yes: NPRS scale: 2/10. Pain location: Left shoulder. Pain description: As above.   Aggravating factors: As above.   Relieving factors: Avoid certain motions.    PRECAUTIONS: None  FALLS:  Has patient fallen in last 6 months? No  LIVING ENVIRONMENT: Lives in: House/apartment Has following equipment at home: None  OCCUPATION: Works for the [employer].    PLOF: Independent  PATIENT GOALS:Use left shoulder without pain and sleep undisturbed by pain.    NEXT MD VISIT:   OBJECTIVE:  PATIENT SURVEYS:  Quick DASH:  59.09  POSTURE: Rounded shoulders.  UPPER EXTREMITY ROM:   WNL  UPPER EXTREMITY MMT: WNL.  SHOULDER SPECIAL TESTS: C/o left shoulder pain with Impingement testing.   PALPATION:  Tender to palpation over left UT/Supraspinatus.                                                                                                                               TREATMENT DATE:   10/09/23:   UBE x 10  90 RPM's  UE ranger standing x 5 mins all motions US  combo 1.5 w/cm2  x 8 mins to LT Utrap/ Levator  STW/M x 10 minutes  to LT Utrap and levator TPR  IFC at 80-150 Hz on 40% scan x 15 minutes  vasopneumatic on low x 15 mins   Normal modality response following removal of modality.  09/18/23:  UBE x 10 minutes f/b Combo e'stim/US  at 1.50 W/CM2 x 7 minutes to patient's left UT f/b STW/M x 10 minutes with ischemic release technique utilized f/b HMP and IFC at 80-150 Hz on 40% scan x 20 minutes.  Normal modality response following removal of modality    PATIENT EDUCATION: Education details: SDLY   Person educated: Patient. Education method: Demo. Education comprehension: Excellent.  HOME EXERCISE PROGRAM: HOME EXERCISE PROGRAM [[id number]]  Sidelying ER  -  Repeat 15 Repetitions, Complete 2 Sets, Perform 2 Times a Day  ASSESSMENT:  CLINICAL IMPRESSION: Patient arrived today doing better overall still LT shldr. He was able to tolerate increased therex today without increased pain and did well.Rx focused on  therex f/b US  combo and STW as well as estim and Vaso to LT shldr and did well.      OBJECTIVE IMPAIRMENTS: decreased activity tolerance, increased muscle spasms, and pain.   ACTIVITY LIMITATIONS: sleeping and reach over head  PARTICIPATION LIMITATIONS: occupation  PERSONAL FACTORS: Time since onset of injury/illness/exacerbation are also affecting patient's functional outcome.   REHAB POTENTIAL: Good  CLINICAL DECISION MAKING: Evolving/moderate complexity  EVALUATION COMPLEXITY: Low   GOALS:  SHORT TERM GOALS: Target date: 09/15/23.  Ind with an initial HEP. Goal status: Met   LONG TERM GOALS: Target date: 10/13/23  Ind with an advanced HEP.  Goal status: On going  2.  Sleep undisturbed 6 hours. Goal status: On going  3.  Perform ADL's with left shoulder pain not > 3/10.  Goal status: On going  4.  Improve Quick DASH score by at least 20%. Goal status: On going   PLAN:  PT FREQUENCY: 2x/week  PT DURATION: 6 weeks  PLANNED INTERVENTIONS: 97110-Therapeutic exercises, 97530- Therapeutic activity, [id number]- Neuromuscular re-education, 97535- Self Care, 02859- Manual therapy, 97016- Vasopneumatic  device, [id number]- Ultrasound, 79439 (1-2 muscles), 20561 (3+ muscles)- Dry Needling, Patient/Family education, Cryotherapy, and Moist heat  PLAN FOR NEXT SESSION: UBE. RW4, sdly ER, combo e'stim/US , STW/M.     Camilla Skeen,CHRIS, PTA 10/09/2023, 5:04 PM

## 2023-10-16 ENCOUNTER — Ambulatory Visit: Admitting: Physical Therapy

## 2023-10-16 DIAGNOSIS — G8929 Other chronic pain: Secondary | ICD-10-CM

## 2023-10-16 DIAGNOSIS — M5459 Other low back pain: Secondary | ICD-10-CM

## 2023-10-16 DIAGNOSIS — M62838 Other muscle spasm: Secondary | ICD-10-CM

## 2023-10-16 DIAGNOSIS — M25512 Pain in left shoulder: Secondary | ICD-10-CM | POA: Diagnosis not present

## 2023-10-16 NOTE — Therapy (Signed)
 OUTPATIENT PHYSICAL THERAPY SHOULDER TREATMENT   Patient Name: Travis Zuniga MRN: [id number] DOB:[birth year], 63 y.o., male Today's Date: 10/16/2023  END OF SESSION:  PT End of Session - 10/16/23 0936     Visit Number 8    Number of Visits 12    Date for Recertification  10/13/23    PT Start Time 0800    PT Stop Time 0904    PT Time Calculation (min) 64 min    Activity Tolerance Patient tolerated treatment well    Behavior During Therapy [facility] for tasks assessed/performed             Past Medical History:  Diagnosis Date   Allergy    seasonal allergies   Arthritis    lower back    GERD (gastroesophageal reflux disease)    with certain foods/prior to weight loss/uses OTC meds PRN   Hx of adenomatous polyp of colon 01/04/2020   diminutive   Subclinical hypothyroidism 08/23/2021   Past Surgical History:  Procedure Laterality Date   COLONOSCOPY  01/05/2020   LUNG SURGERY N/A 1961   lung surgery-lung collapsed-   VASECTOMY N/A    WISDOM TOOTH EXTRACTION     Patient Active Problem List   Diagnosis Date Noted   Knee pain, right 09/10/2023   Left shoulder pain 08/21/2023   Transaminitis 09/13/2022   Hematospermia 06/13/2022   Chronic low back pain 12/17/2021   Prediabetes 08/23/2021   Hypothyroidism 08/23/2021   Hepatic steatosis 08/23/2021   Wellness examination 03/08/2021   Positive ANA (antinuclear antibody) 01/17/2021   Obesity, Class I, BMI 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  THERAPY DIAG:  Chronic left shoulder pain  Other muscle spasm  Other low back pain  Rationale for Evaluation and Treatment: Rehabilitation  ONSET DATE: January, 2025.    SUBJECTIVE:                                                                                                                                                                                       SUBJECTIVE STATEMENT: Feeling about 75% better.  PERTINENT HISTORY: See above.    PAIN:  Are you having pain? Yes: NPRS scale: 2/10. Pain location: Left shoulder. Pain description: As above.   Aggravating factors: As above.   Relieving factors: Avoid certain motions.    PRECAUTIONS: None  FALLS:  Has patient  fallen in last 6 months? No  LIVING ENVIRONMENT: Lives in: House/apartment Has following equipment at home: None  OCCUPATION: Works for the [employer].    PLOF: Independent  PATIENT GOALS:Use left shoulder without pain and sleep undisturbed by pain.    NEXT MD VISIT:   OBJECTIVE:  PATIENT SURVEYS:  Quick DASH:  59.09  POSTURE: Rounded shoulders.  UPPER EXTREMITY ROM:   WNL  UPPER EXTREMITY MMT: WNL.  SHOULDER SPECIAL TESTS: C/o left shoulder pain with Impingement testing.   PALPATION:  Tender to palpation over left UT/Supraspinatus.                                                                                                                               TREATMENT DATE:   Nustep level 3 x 10 minutes f/b Combo e'stim/US  at 1.50 W/CM2 x 14 minutes to patient's left posterior cuff musculature f/b STW/M x 14 minutes f/b IFC at 80-150 Hz on 40% scan x 20 minutes.  Normal modality resposne following removal of modality.   10/09/23:   UBE x 10  90 RPM's  UE ranger standing x 5 mins all motions US  combo 1.5 w/cm2  x 8 mins to LT Utrap/ Levator  STW/M x 10 minutes  to LT Utrap and levator TPR  IFC at 80-150 Hz on 40% scan x 15 minutes  vasopneumatic on low x 15 mins   Normal modality response following removal of modality.  09/18/23:  UBE x 10 minutes f/b Combo e'stim/US  at 1.50 W/CM2 x 7 minutes to patient's left UT f/b STW/M x 10 minutes with ischemic release technique utilized f/b HMP and  IFC at 80-150 Hz on 40% scan x 20 minutes.  Normal modality response following removal of modality    PATIENT EDUCATION: Education details: SDLY   Person educated: Patient. Education method: Demo. Education comprehension: Excellent.  HOME EXERCISE PROGRAM: HOME EXERCISE PROGRAM [[facility]]  Sidelying ER -  Repeat 15 Repetitions, Complete 2 Sets, Perform 2 Times a Day  ASSESSMENT:  CLINICAL IMPRESSION: Patient is highly motivated and compliant to his HEP.  He reports a subjective improvement rating of 75%.        OBJECTIVE IMPAIRMENTS: decreased activity tolerance, increased muscle spasms, and pain.   ACTIVITY LIMITATIONS: sleeping and reach over head  PARTICIPATION LIMITATIONS: occupation  PERSONAL FACTORS: Time since onset of injury/illness/exacerbation are also affecting patient's functional outcome.   REHAB POTENTIAL: Good  CLINICAL DECISION MAKING: Evolving/moderate complexity  EVALUATION COMPLEXITY: Low   GOALS:  SHORT TERM GOALS: Target date: 09/15/23.  Ind with an initial HEP. Goal status: Met   LONG TERM GOALS: Target date: 10/13/23  Ind with an advanced HEP.  Goal status: On going  2.  Sleep undisturbed 6 hours. Goal status: On going  3.  Perform ADL's with left shoulder pain not > 3/10.  Goal status: On going  4.  Improve Quick DASH score by at least 20%. Goal status: On going  PLAN:  PT FREQUENCY: 2x/week  PT DURATION: 6 weeks  PLANNED INTERVENTIONS: 97110-Therapeutic exercises, 97530- Therapeutic activity, [id number]- Neuromuscular re-education, 97535- Self Care, 02859- Manual therapy, 97016- Vasopneumatic device, [id number]- Ultrasound, 79439 (1-2 muscles), 20561 (3+ muscles)- Dry Needling, Patient/Family education, Cryotherapy, and Moist heat  PLAN FOR NEXT SESSION: UBE. RW4, sdly ER, combo e'stim/US , STW/M.     Timmy Cleverly, [place], PT 10/16/2023, 10:18 AM

## 2023-10-22 ENCOUNTER — Ambulatory Visit: Admitting: *Deleted

## 2023-10-22 DIAGNOSIS — M5459 Other low back pain: Secondary | ICD-10-CM

## 2023-10-22 DIAGNOSIS — M62838 Other muscle spasm: Secondary | ICD-10-CM

## 2023-10-22 DIAGNOSIS — M25512 Pain in left shoulder: Secondary | ICD-10-CM | POA: Diagnosis not present

## 2023-10-22 DIAGNOSIS — G8929 Other chronic pain: Secondary | ICD-10-CM

## 2023-10-22 NOTE — Therapy (Signed)
 OUTPATIENT PHYSICAL THERAPY SHOULDER TREATMENT   Patient Name: Travis Zuniga MRN: [id number] DOB:[birth year], 63 y.o., male Today's Date: 10/22/2023  END OF SESSION:  PT End of Session - 10/22/23 0810     Visit Number 9    Number of Visits 12    Date for Recertification  10/13/23    Authorization - Number of Visits 12    PT Start Time 0800    PT Stop Time 0858    PT Time Calculation (min) 58 min             Past Medical History:  Diagnosis Date   Allergy    seasonal allergies   Arthritis    lower back    GERD (gastroesophageal reflux disease)    with certain foods/prior to weight loss/uses OTC meds PRN   Hx of adenomatous polyp of colon 01/04/2020   diminutive   Subclinical hypothyroidism 08/23/2021   Past Surgical History:  Procedure Laterality Date   COLONOSCOPY  01/05/2020   LUNG SURGERY N/A 1961   lung surgery-lung collapsed-   VASECTOMY N/A    WISDOM TOOTH EXTRACTION     Patient Active Problem List   Diagnosis Date Noted   Knee pain, right 09/10/2023   Left shoulder pain 08/21/2023   Transaminitis 09/13/2022   Hematospermia 06/13/2022   Chronic low back pain 12/17/2021   Prediabetes 08/23/2021   Hypothyroidism 08/23/2021   Hepatic steatosis 08/23/2021   Wellness examination 03/08/2021   Positive ANA (antinuclear antibody) 01/17/2021   Obesity, Class I, BMI 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  THERAPY DIAG:  Chronic left shoulder pain  Other muscle spasm  Other low back pain  Rationale for Evaluation and Treatment: Rehabilitation  ONSET DATE: January, 2025.    SUBJECTIVE:                                                                                                                                                                                       SUBJECTIVE STATEMENT: Feeling about 75% better LT shldr   PERTINENT HISTORY: See above.    PAIN:  Are you having pain? Yes: NPRS scale: 2/10. Pain location: Left shoulder. Pain description: As above.   Aggravating factors: As above.   Relieving factors: Avoid certain motions.    PRECAUTIONS: None  FALLS:  Has patient fallen in last 6 months? No  LIVING ENVIRONMENT: Lives in: House/apartment Has following equipment at home: None  OCCUPATION: Works for the [employer].    PLOF: Independent  PATIENT GOALS:Use left shoulder without pain and sleep undisturbed by pain.    NEXT MD VISIT:   OBJECTIVE:  PATIENT SURVEYS:  Quick DASH:  59.09  POSTURE: Rounded shoulders.  UPPER EXTREMITY ROM:   WNL  UPPER EXTREMITY MMT: WNL.  SHOULDER SPECIAL TESTS: C/o left shoulder pain with Impingement testing.   PALPATION:  Tender to palpation over left UT/Supraspinatus.                                                                                                                               TREATMENT DATE:  10-22-23 UBE  level 3 x 10 minutes  T's and Ys'   2x10 each RED tband Bil shldrs  STW/M x 13 minutes   IFC at 80-150 Hz on 40% scan x 15 minutes. With HMP Normal modality resposne following removal of modality.   10/09/23:   UBE x 10  90 RPM's  UE ranger standing x 5 mins all motions US  combo 1.5 w/cm2  x 8 mins to LT Utrap/ Levator  STW/M x 10 minutes  to LT Utrap and levator TPR  IFC at 80-150 Hz on 40% scan x 15 minutes  vasopneumatic on low x 15 mins   Normal modality response following removal of modality.  09/18/23:  UBE x 10 minutes f/b Combo e'stim/US  at 1.50 W/CM2 x 7 minutes to patient's left UT f/b STW/M x 10 minutes with ischemic release technique utilized f/b HMP and IFC at 80-150 Hz on 40% scan x 20 minutes.  Normal modality response following removal of  modality    PATIENT EDUCATION: Education details: SDLY   Person educated: Patient. Education method: Demo. Education comprehension: Excellent.  HOME EXERCISE PROGRAM: HOME EXERCISE PROGRAM [[id number]]  Sidelying ER -  Repeat 15 Repetitions, Complete 2 Sets, Perform 2 Times a Day  ASSESSMENT:  CLINICAL IMPRESSION: Patient arrived today doing fairly well with LT shldr. He was able to continue with therex with added Ts and Y's with Red tband and did well.   He reports a subjective improvement rating of 75%.        OBJECTIVE IMPAIRMENTS: decreased activity tolerance, increased muscle spasms, and pain.   ACTIVITY LIMITATIONS: sleeping and reach over head  PARTICIPATION LIMITATIONS: occupation  PERSONAL FACTORS: Time since onset of injury/illness/exacerbation are also affecting patient's functional outcome.   REHAB POTENTIAL: Good  CLINICAL DECISION MAKING: Evolving/moderate complexity  EVALUATION COMPLEXITY: Low   GOALS:  SHORT TERM GOALS: Target date: 09/15/23.  Ind with an initial HEP. Goal status: Met   LONG TERM GOALS: Target date: 10/13/23  Ind with an advanced HEP.  Goal status: MET  2.  Sleep undisturbed 6 hours. Goal status: Partially met  3.  Perform ADL's with left shoulder pain not > 3/10.  Goal status: On going  4.  Improve Quick  DASH score by at least 20%. Goal status: On going   PLAN:  PT FREQUENCY: 2x/week  PT DURATION: 6 weeks  PLANNED INTERVENTIONS: 97110-Therapeutic exercises, 97530- Therapeutic activity, [id number]- Neuromuscular re-education, 97535- Self Care, 02859- Manual therapy, 97016- Vasopneumatic device, [id number]- Ultrasound, 79439 (1-2 muscles), 20561 (3+ muscles)- Dry Needling, Patient/Family education, Cryotherapy, and Moist heat  PLAN FOR NEXT SESSION: UBE. , combo e'stim/US , STW/M.  Tband exs   Tashay Bozich,CHRIS, PTA 10/22/2023, 8:58 AM

## 2023-10-29 ENCOUNTER — Ambulatory Visit: Admitting: Physical Therapy

## 2023-10-29 DIAGNOSIS — M25512 Pain in left shoulder: Secondary | ICD-10-CM | POA: Diagnosis not present

## 2023-10-29 DIAGNOSIS — G8929 Other chronic pain: Secondary | ICD-10-CM

## 2023-10-29 DIAGNOSIS — M5459 Other low back pain: Secondary | ICD-10-CM

## 2023-10-29 DIAGNOSIS — M62838 Other muscle spasm: Secondary | ICD-10-CM

## 2023-10-29 NOTE — Therapy (Signed)
 OUTPATIENT PHYSICAL THERAPY SHOULDER TREATMENT   Patient Name: Travis Zuniga MRN: [id number] DOB:[birth year], 63 y.o., male Today's Date: 10/29/2023  END OF SESSION:  PT End of Session - 10/29/23 0810     Visit Number 10    Number of Visits 12    Date for Recertification  10/13/23    PT Start Time 0801    PT Stop Time 0905    PT Time Calculation (min) 64 min    Activity Tolerance Patient tolerated treatment well    Behavior During Therapy [facility] for tasks assessed/performed              Past Medical History:  Diagnosis Date   Allergy    seasonal allergies   Arthritis    lower back    GERD (gastroesophageal reflux disease)    with certain foods/prior to weight loss/uses OTC meds PRN   Hx of adenomatous polyp of colon 01/04/2020   diminutive   Subclinical hypothyroidism 08/23/2021   Past Surgical History:  Procedure Laterality Date   COLONOSCOPY  01/05/2020   LUNG SURGERY N/A 1961   lung surgery-lung collapsed-   VASECTOMY N/A    WISDOM TOOTH EXTRACTION     Patient Active Problem List   Diagnosis Date Noted   Knee pain, right 09/10/2023   Left shoulder pain 08/21/2023   Transaminitis 09/13/2022   Hematospermia 06/13/2022   Chronic low back pain 12/17/2021   Prediabetes 08/23/2021   Hypothyroidism 08/23/2021   Hepatic steatosis 08/23/2021   Wellness examination 03/08/2021   Positive ANA (antinuclear antibody) 01/17/2021   Obesity, Class I, BMI 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  THERAPY DIAG:  Chronic left shoulder pain  Other muscle spasm  Other low back pain  Rationale for Evaluation and Treatment: Rehabilitation  ONSET DATE: January, 2025.    SUBJECTIVE:                                                                                                                                                                                       SUBJECTIVE STATEMENT: Overall, doing much better.    PERTINENT HISTORY: See above.    PAIN:  Are you having pain? Yes: NPRS scale: 2/10. Pain location: Left shoulder. Pain description: As above.   Aggravating factors: As above.   Relieving factors: Avoid certain motions.    PRECAUTIONS: None  FALLS:  Has patient fallen in last 6 months? No  LIVING ENVIRONMENT: Lives in: House/apartment Has following equipment at home: None  OCCUPATION: Works for the [employer].    PLOF: Independent  PATIENT GOALS:Use left shoulder without pain and sleep undisturbed by pain.    NEXT MD VISIT:   OBJECTIVE:  PATIENT SURVEYS:  Quick DASH:  59.09  POSTURE: Rounded shoulders.  UPPER EXTREMITY ROM:   WNL  UPPER EXTREMITY MMT: WNL.  SHOULDER SPECIAL TESTS: C/o left shoulder pain with Impingement testing.   PALPATION:  Tender to palpation over left UT/Supraspinatus.                                                                                                                               TREATMENT DATE:   10/29/23:  UBE at 90 RPM's x 10 minutes f/b STW/M x 28 minutes including IASTM to patient's left UT, deltoids, posterior cuff and scapular musculature  f/b IFC at 80-150 Hz on 40% scan x 20 minutes.  Normal modality resposne following removal of modality.  10-22-23 UBE  level 3 x 10 minutes  T's and Ys'   2x10 each RED tband Bil shldrs  STW/M x 13 minutes   IFC at 80-150 Hz on 40% scan x 15 minutes. With HMP Normal modality resposne following removal of modality.   10/09/23:   UBE x 10  90 RPM's  UE ranger standing x 5 mins all motions US  combo 1.5 w/cm2  x 8 mins to LT Utrap/ Levator  STW/M x 10 minutes  to LT Utrap and levator TPR  IFC at 80-150 Hz on 40% scan x 15 minutes  vasopneumatic on low x  15 mins   Normal modality response following removal of modality.    PATIENT EDUCATION: Education details: SDLY   Person educated: Patient. Education method: Demo. Education comprehension: Excellent.  HOME EXERCISE PROGRAM: HOME EXERCISE PROGRAM [[facility]]  Sidelying ER -  Repeat 15 Repetitions, Complete 2 Sets, Perform 2 Times a Day  ASSESSMENT:  CLINICAL IMPRESSION: See below.        OBJECTIVE IMPAIRMENTS: decreased activity tolerance, increased muscle spasms, and pain.   ACTIVITY LIMITATIONS: sleeping and reach over head  PARTICIPATION LIMITATIONS: occupation  PERSONAL FACTORS: Time since onset of injury/illness/exacerbation are also affecting patient's functional outcome.   REHAB POTENTIAL: Good  CLINICAL DECISION MAKING: Evolving/moderate complexity  EVALUATION COMPLEXITY: Low   GOALS:  SHORT TERM GOALS: Target date: 09/15/23.  Ind with an initial HEP. Goal status: Met   LONG TERM GOALS: Target date: 10/13/23  Ind with an advanced HEP.  Goal status: MET  2.  Sleep undisturbed 6 hours. Goal status: MET. 3.  Perform ADL's with left shoulder pain not > 3/10.  Goal status: Partially met.  4.  Improve Quick DASH score by at least 20%. Goal status: On going   PLAN:  PT FREQUENCY: 2x/week  PT DURATION: 6 weeks  PLANNED INTERVENTIONS: 97110-Therapeutic exercises, 97530- Therapeutic activity, 97112-  Neuromuscular re-education, [id number]- Self Care, 02859- Manual therapy, [id number]- Vasopneumatic device, [id number]- Ultrasound, [id number] (1-2 muscles), 20561 (3+ muscles)- Dry Needling, Patient/Family education, Cryotherapy, and Moist heat  PLAN FOR NEXT SESSION: UBE. , combo e'stim/US , STW/M.  Tband exs  Progress Note Reporting Period 09/01/23 to 10/29/23:  See note below for Objective Data and Assessment of Progress/Goals. Overall patient is making very good progress toward goals.     Haydin Calandra, [place], PT 10/29/2023, 9:58 AM

## 2023-10-30 ENCOUNTER — Other Ambulatory Visit (HOSPITAL_BASED_OUTPATIENT_CLINIC_OR_DEPARTMENT_OTHER)

## 2023-10-30 ENCOUNTER — Other Ambulatory Visit (HOSPITAL_BASED_OUTPATIENT_CLINIC_OR_DEPARTMENT_OTHER): Payer: Self-pay | Admitting: *Deleted

## 2023-10-30 ENCOUNTER — Encounter (HOSPITAL_BASED_OUTPATIENT_CLINIC_OR_DEPARTMENT_OTHER): Payer: Self-pay

## 2023-10-30 DIAGNOSIS — E039 Hypothyroidism, unspecified: Secondary | ICD-10-CM

## 2023-10-31 LAB — TSH: TSH: 8.53 u[IU]/mL — ABNORMAL HIGH (ref 0.450–4.500)

## 2023-11-05 ENCOUNTER — Ambulatory Visit: Admitting: Physical Therapy

## 2023-11-05 DIAGNOSIS — G8929 Other chronic pain: Secondary | ICD-10-CM

## 2023-11-05 DIAGNOSIS — M25512 Pain in left shoulder: Secondary | ICD-10-CM | POA: Diagnosis not present

## 2023-11-05 DIAGNOSIS — M62838 Other muscle spasm: Secondary | ICD-10-CM

## 2023-11-05 DIAGNOSIS — M5459 Other low back pain: Secondary | ICD-10-CM

## 2023-11-05 NOTE — Therapy (Signed)
 OUTPATIENT PHYSICAL THERAPY SHOULDER TREATMENT   Patient Name: Travis Zuniga MRN: [id number] DOB:[birth year], 63 y.o., male Today's Date: 11/05/2023  END OF SESSION:  PT End of Session - 11/05/23 0957     Visit Number 11    Number of Visits 12    Date for Recertification  10/13/23    PT Start Time 0800    PT Stop Time 0902    PT Time Calculation (min) 62 min    Activity Tolerance Patient tolerated treatment well    Behavior During Therapy [facility] for tasks assessed/performed               Past Medical History:  Diagnosis Date   Allergy    seasonal allergies   Arthritis    lower back    GERD (gastroesophageal reflux disease)    with certain foods/prior to weight loss/uses OTC meds PRN   Hx of adenomatous polyp of colon 01/04/2020   diminutive   Subclinical hypothyroidism 08/23/2021   Past Surgical History:  Procedure Laterality Date   COLONOSCOPY  01/05/2020   LUNG SURGERY N/A 1961   lung surgery-lung collapsed-   VASECTOMY N/A    WISDOM TOOTH EXTRACTION     Patient Active Problem List   Diagnosis Date Noted   Knee pain, right 09/10/2023   Left shoulder pain 08/21/2023   Transaminitis 09/13/2022   Hematospermia 06/13/2022   Chronic low back pain 12/17/2021   Prediabetes 08/23/2021   Hypothyroidism 08/23/2021   Hepatic steatosis 08/23/2021   Wellness examination 03/08/2021   Positive ANA (antinuclear antibody) 01/17/2021   Obesity, Class I, BMI 30-34.9 01/17/2021   Hx of adenomatous polyp of colon 01/04/2020   Class 2 obesity due to excess calories without serious comorbidity with body mass index (BMI) of 35.0 to 35.9 in adult 09/20/2019   Tinnitus of both ears 09/20/2019    REFERRING PROVIDER: Raymond de Cuba MD  REFERRING DIAG: Chronic left shoulder pain.  THERAPY DIAG:  Chronic left shoulder pain  Other muscle spasm  Other low back pain  Rationale for Evaluation and Treatment: Rehabilitation  ONSET DATE: January, 2025.    SUBJECTIVE:                                                                                                                                                                                       SUBJECTIVE STATEMENT: Patient states day to day pain is essentially gone but will get some pain with work activities.    PERTINENT HISTORY: See above.    PAIN:  Are you having pain? Yes: NPRS scale: 2/10. Pain location: Left shoulder. Pain description: As above.   Aggravating factors: As above.  Relieving factors: Avoid certain motions.    PRECAUTIONS: None  FALLS:  Has patient fallen in last 6 months? No  LIVING ENVIRONMENT: Lives in: House/apartment Has following equipment at home: None  OCCUPATION: Works for the [employer].    PLOF: Independent  PATIENT GOALS:Use left shoulder without pain and sleep undisturbed by pain.    NEXT MD VISIT:   OBJECTIVE:  PATIENT SURVEYS:  Quick DASH:  59.09  POSTURE: Rounded shoulders.  UPPER EXTREMITY ROM:   WNL  UPPER EXTREMITY MMT: WNL.  SHOULDER SPECIAL TESTS: C/o left shoulder pain with Impingement testing.   PALPATION:  Tender to palpation over left UT/Supraspinatus.                                                                                                                               TREATMENT DATE:  11/05/23:  UBE at 90 RPM's x 10 minutes f/b STW/M x 28 minutes to patient's left UT, deltoids, posterior cuff and scapular musculature  f/b IFC at 80-150 Hz on 40% scan x 20 minutes.  Normal modality resposne following removal of modality.    10/29/23:  UBE at 90 RPM's x 10 minutes f/b STW/M x 28 minutes including IASTM to patient's left UT, deltoids, posterior cuff and scapular musculature  f/b IFC at 80-150 Hz on 40% scan x 20 minutes.  Normal modality resposne following removal of modality.  10-22-23 UBE  level 3 x 10 minutes  T's and Ys'   2x10 each RED tband Bil shldrs  STW/M x 13 minutes   IFC at 80-150 Hz on 40% scan x 15 minutes.  With HMP Normal modality resposne following removal of modality.   10/09/23:   UBE x 10  90 RPM's  UE ranger standing x 5 mins all motions US  combo 1.5 w/cm2  x 8 mins to LT Utrap/ Levator  STW/M x 10 minutes  to LT Utrap and levator TPR  IFC at 80-150 Hz on 40% scan x 15 minutes  vasopneumatic on low x 15 mins   Normal modality response following removal of modality.    PATIENT EDUCATION: Education details: SDLY   Person educated: Patient. Education method: Demo. Education comprehension: Excellent.  HOME EXERCISE PROGRAM: HOME EXERCISE PROGRAM [[facility]]  Sidelying ER -  Repeat 15 Repetitions, Complete 2 Sets, Perform 2 Times a Day  ASSESSMENT:  CLINICAL IMPRESSION: One visit remaining.  No pain reported after treatment.       OBJECTIVE IMPAIRMENTS: decreased activity tolerance, increased muscle spasms, and pain.   ACTIVITY LIMITATIONS: sleeping and reach over head  PARTICIPATION LIMITATIONS: occupation  PERSONAL FACTORS: Time since onset of injury/illness/exacerbation are also affecting patient's functional outcome.   REHAB POTENTIAL: Good  CLINICAL DECISION MAKING: Evolving/moderate complexity  EVALUATION COMPLEXITY: Low   GOALS:  SHORT TERM GOALS: Target date: 09/15/23.  Ind with an initial HEP. Goal status: Met   LONG TERM GOALS: Target date: 10/13/23  Ind with an advanced HEP.  Goal status: MET  2.  Sleep undisturbed 6 hours. Goal status: MET. 3.  Perform ADL's with left shoulder pain not > 3/10.  Goal status: Partially met.  4.  Improve Quick DASH score by at least 20%. Goal status: On going   PLAN:  PT FREQUENCY: 2x/week  PT DURATION: 6 weeks  PLANNED INTERVENTIONS: 97110-Therapeutic exercises, 97530- Therapeutic activity, [id number]- Neuromuscular re-education, 97535- Self Care, 02859- Manual therapy, 97016- Vasopneumatic device, [id number]- Ultrasound, 79439 (1-2 muscles), 20561 (3+ muscles)- Dry Needling, Patient/Family education, Cryotherapy, and  Moist heat  PLAN FOR NEXT SESSION: D/c  Kamile Fassler, PT 11/05/2023, 10:01 AM

## 2023-11-07 ENCOUNTER — Ambulatory Visit: Admitting: *Deleted

## 2023-11-10 ENCOUNTER — Ambulatory Visit (INDEPENDENT_AMBULATORY_CARE_PROVIDER_SITE_OTHER): Admitting: Family Medicine

## 2023-11-10 VITALS — BP 129/85 | HR 57 | Temp 97.6°F | Resp 18 | Ht 71.0 in | Wt 242.0 lb

## 2023-11-10 DIAGNOSIS — M25512 Pain in left shoulder: Secondary | ICD-10-CM

## 2023-11-10 DIAGNOSIS — G8929 Other chronic pain: Secondary | ICD-10-CM | POA: Diagnosis not present

## 2023-11-10 DIAGNOSIS — E039 Hypothyroidism, unspecified: Secondary | ICD-10-CM | POA: Diagnosis not present

## 2023-11-10 DIAGNOSIS — M25561 Pain in right knee: Secondary | ICD-10-CM | POA: Diagnosis not present

## 2023-11-10 NOTE — Assessment & Plan Note (Signed)
 Left shoulder pain likely rotator cuff tendinopathy, possible degenerative tearing and bursitis Chronic left shoulder pain with prior good range of motion, likely due to partial tearing or fraying of rotator cuff tendons and associated bursitis. - Provided handout with home exercises for shoulder pain at last visit - Referred to physical therapy for evaluation and treatment at last visit - this has been going well - Considered steroid injection if conservative measures are not effective. Consider XR if symptoms persist

## 2023-11-10 NOTE — Assessment & Plan Note (Signed)
 Prior x-rays did show evidence of osteoarthritis, most notable within medial compartment.  Patient has been focusing on home exercise program and generally has been doing well. We discussed considerations, he will continue with home exercise program.  Discussed utilization of additional interventions as needed such as steroid injection.

## 2023-11-10 NOTE — Progress Notes (Signed)
    Procedures performed today:    None.  Independent interpretation of notes and tests performed by another provider:   None.  Brief History, Exam, Impression, and Recommendations:    BP 129/85 (BP Location: Left Arm, Patient Position: Sitting, Cuff Size: Normal)   Pulse (!) 57   Temp 97.6 F (36.4 C) (Oral)   Resp 18   Ht 5' 11 (1.803 m)   Wt 242 lb (109.8 kg)   SpO2 97%   BMI 33.75 kg/m   Hypothyroidism, unspecified type Assessment & Plan: TSH above reference range on recent labs, however is less than 10.  We discussed starting age-appropriate TSH and management of levothyroxine .  Patient currently is clinically euthyroid. We discussed considerations related to managing medication.  He would prefer to continue with current dose of levothyroxine .  Will plan to recheck TSH before next appointment in a few months.  If TSH increases further beyond 10, then would like to increase dose of medication.  We discussed consideration for taking 1 tablet daily of levothyroxine  with extra tablet taken on a couple days during the week which we may consider if TSH increases further  Orders: -     TSH; Future  Chronic left shoulder pain Assessment & Plan: Left shoulder pain likely rotator cuff tendinopathy, possible degenerative tearing and bursitis Chronic left shoulder pain with prior good range of motion, likely due to partial tearing or fraying of rotator cuff tendons and associated bursitis. - Provided handout with home exercises for shoulder pain at last visit - Referred to physical therapy for evaluation and treatment at last visit - this has been going well - Considered steroid injection if conservative measures are not effective. Consider XR if symptoms persist   Right knee pain, unspecified chronicity Assessment & Plan: Prior x-rays did show evidence of osteoarthritis, most notable within medial compartment.  Patient has been focusing on home exercise program and generally has  been doing well. We discussed considerations, he will continue with home exercise program.  Discussed utilization of additional interventions as needed such as steroid injection.   Return in about 4 months (around 03/09/2024) for hypothyroidism, med check.  Spent 32 minutes on this patient encounter, including preparation, chart review, face-to-face counseling with patient and coordination of care, and documentation of encounter   ___________________________________________ Timica Marcom de [place], MD, ABFM, [facility] Primary Care and Sports Medicine [facility]

## 2023-11-10 NOTE — Assessment & Plan Note (Signed)
 TSH above reference range on recent labs, however is less than 10.  We discussed starting age-appropriate TSH and management of levothyroxine .  Patient currently is clinically euthyroid. We discussed considerations related to managing medication.  He would prefer to continue with current dose of levothyroxine .  Will plan to recheck TSH before next appointment in a few months.  If TSH increases further beyond 10, then would like to increase dose of medication.  We discussed consideration for taking 1 tablet daily of levothyroxine  with extra tablet taken on a couple days during the week which we may consider if TSH increases further

## 2023-11-14 ENCOUNTER — Ambulatory Visit: Attending: Family Medicine | Admitting: Physical Therapy

## 2023-11-14 DIAGNOSIS — M62838 Other muscle spasm: Secondary | ICD-10-CM | POA: Insufficient documentation

## 2023-11-14 DIAGNOSIS — M25512 Pain in left shoulder: Secondary | ICD-10-CM | POA: Diagnosis present

## 2023-11-14 DIAGNOSIS — G8929 Other chronic pain: Secondary | ICD-10-CM | POA: Diagnosis present

## 2023-11-14 DIAGNOSIS — M5459 Other low back pain: Secondary | ICD-10-CM | POA: Diagnosis present

## 2023-11-14 NOTE — Therapy (Signed)
 OUTPATIENT PHYSICAL THERAPY SHOULDER TREATMENT   Patient Name: Travis Zuniga MRN: [id number] DOB:June 22, 1960, 63 y.o., male Today's Date: 11/14/2023  END OF SESSION:  PT End of Session - 11/14/23 0811     Visit Number 12    Number of Visits 12    Date for Recertification  10/13/23    PT Start Time 0803    PT Stop Time 0902    PT Time Calculation (min) 59 min    Activity Tolerance Patient tolerated treatment well    Behavior During Therapy [facility] for tasks assessed/performed               Past Medical History:  Diagnosis Date   Allergy    seasonal allergies   Arthritis    lower back    GERD (gastroesophageal reflux disease)    with certain foods/prior to weight loss/uses OTC meds PRN   Hx of adenomatous polyp of colon 01/04/2020   diminutive   Subclinical hypothyroidism 08/23/2021   Past Surgical History:  Procedure Laterality Date   COLONOSCOPY  01/05/2020   LUNG SURGERY N/A 1961   lung surgery-lung collapsed-   VASECTOMY N/A    WISDOM TOOTH EXTRACTION     Patient Active Problem List   Diagnosis Date Noted   Knee pain, right 09/10/2023   Left shoulder pain 08/21/2023   Transaminitis 09/13/2022   Hematospermia 06/13/2022   Chronic low back pain 12/17/2021   Prediabetes 08/23/2021   Hypothyroidism 08/23/2021   Hepatic steatosis 08/23/2021   Wellness examination 03/08/2021   Positive ANA (antinuclear antibody) 01/17/2021   Obesity, Class I, BMI 30-34.9 01/17/2021   Hx of adenomatous polyp of colon 01/04/2020   Class 2 obesity due to excess calories without serious comorbidity with body mass index (BMI) of 35.0 to 35.9 in adult 09/20/2019   Tinnitus of both ears 09/20/2019    REFERRING PROVIDER: Raymond de Cuba MD  REFERRING DIAG: Chronic left shoulder pain.  THERAPY DIAG:  Chronic left shoulder pain  Other muscle spasm  Other low back pain  Rationale for Evaluation and Treatment: Rehabilitation  ONSET DATE: January, 2025.    SUBJECTIVE:                                                                                                                                                                                       SUBJECTIVE STATEMENT: Went on a 2 day hike.  Overall, shoulder did well.    PERTINENT HISTORY: See above.    PAIN:  Are you having pain? Yes: NPRS scale: 2/10. Pain location: Left shoulder. Pain description: As above.   Aggravating factors: As above.   Relieving factors: Avoid certain  motions.    PRECAUTIONS: None  FALLS:  Has patient fallen in last 6 months? No  LIVING ENVIRONMENT: Lives in: House/apartment Has following equipment at home: None  OCCUPATION: Works for the [employer].    PLOF: Independent  PATIENT GOALS:Use left shoulder without pain and sleep undisturbed by pain.    NEXT MD VISIT:   OBJECTIVE:  PATIENT SURVEYS:  Quick DASH:  59.09  POSTURE: Rounded shoulders.  UPPER EXTREMITY ROM:   WNL  UPPER EXTREMITY MMT: WNL.  SHOULDER SPECIAL TESTS: C/o left shoulder pain with Impingement testing.   PALPATION:  Tender to palpation over left UT/Supraspinatus.                                                                                                                               TREATMENT DATE:  11/14/23:  UBE at 90 RPM's x 10 minutes f/b STW/M x 28 minutes to patient's left UT, deltoids, posterior cuff and scapular musculature  f/b IFC at 80-150 Hz on 40% scan x 15 minutes.  Normal modality resposne following removal of modality.  11/05/23:  UBE at 90 RPM's x 10 minutes f/b STW/M x 28 minutes to patient's left UT, deltoids, posterior cuff and scapular musculature  f/b IFC at 80-150 Hz on 40% scan x 20 minutes.  Normal modality resposne following removal of modality.    10/29/23:  UBE at 90 RPM's x 10 minutes f/b STW/M x 28 minutes including IASTM to patient's left UT, deltoids, posterior cuff and scapular musculature  f/b IFC at 80-150 Hz on 40% scan x 20 minutes.  Normal modality  resposne following removal of modality.     PATIENT EDUCATION: Education details: SDLY   Person educated: Patient. Education method: Demo. Education comprehension: Excellent.  HOME EXERCISE PROGRAM: HOME EXERCISE PROGRAM [[id number]]  Sidelying ER -  Repeat 15 Repetitions, Complete 2 Sets, Perform 2 Times a Day  ASSESSMENT:  CLINICAL IMPRESSION: See below.    OBJECTIVE IMPAIRMENTS: decreased activity tolerance, increased muscle spasms, and pain.   ACTIVITY LIMITATIONS: sleeping and reach over head  PARTICIPATION LIMITATIONS: occupation  PERSONAL FACTORS: Time since onset of injury/illness/exacerbation are also affecting patient's functional outcome.   REHAB POTENTIAL: Good  CLINICAL DECISION MAKING: Evolving/moderate complexity  EVALUATION COMPLEXITY: Low   GOALS:  SHORT TERM GOALS: Target date: 09/15/23.  Ind with an initial HEP. Goal status: Met   LONG TERM GOALS: Target date: 10/13/23  Ind with an advanced HEP.  Goal status: MET  2.  Sleep undisturbed 6 hours. Goal status: MET. 3.  Perform ADL's with left shoulder pain not > 3/10.  Goal status: MET. 4.  Improve Quick DASH score by at least 20%. Goal status: MET (9.09% 11/7).   PLAN:  PT FREQUENCY: 2x/week  PT DURATION: 6 weeks  PLANNED INTERVENTIONS: 97110-Therapeutic exercises, 97530- Therapeutic activity, [id number]- Neuromuscular re-education, 97535- Self Care, [id number]- Manual therapy, [id number]- Vasopneumatic device, [id number]- Ultrasound, [id number] (1-2 muscles), 20561 (3+ muscles)- Dry Needling, Patient/Family  education, Cryotherapy, and Moist heat  PLAN FOR NEXT SESSION: D/c  PHYSICAL THERAPY DISCHARGE SUMMARY  Visits from Start of Care: 12.  Current functional level related to goals / functional outcomes: See above.     Remaining deficits: All goals met.   Education / Equipment: HEP.   Patient agrees to discharge. Patient goals were met. Patient is being discharged due to meeting the stated rehab  goals.    Kalsey Lull, PT 11/14/2023, 9:02 AM

## 2024-01-09 ENCOUNTER — Encounter (HOSPITAL_BASED_OUTPATIENT_CLINIC_OR_DEPARTMENT_OTHER): Payer: Self-pay | Admitting: Family Medicine

## 2024-02-11 ENCOUNTER — Other Ambulatory Visit (HOSPITAL_BASED_OUTPATIENT_CLINIC_OR_DEPARTMENT_OTHER): Payer: Self-pay | Admitting: Family Medicine

## 2024-03-10 ENCOUNTER — Ambulatory Visit (HOSPITAL_BASED_OUTPATIENT_CLINIC_OR_DEPARTMENT_OTHER): Admitting: Family Medicine
